# Patient Record
Sex: Female | Born: 1990 | Hispanic: No | Marital: Married | Smoking: Never smoker
Health system: Southern US, Community
[De-identification: ages and names within clinical notes are randomized; demographics above are authoritative.]

## PROBLEM LIST (undated history)

## (undated) ENCOUNTER — Inpatient Hospital Stay (HOSPITAL_COMMUNITY): Payer: Self-pay

## (undated) DIAGNOSIS — D649 Anemia, unspecified: Secondary | ICD-10-CM

## (undated) DIAGNOSIS — Z789 Other specified health status: Secondary | ICD-10-CM

## (undated) DIAGNOSIS — F419 Anxiety disorder, unspecified: Secondary | ICD-10-CM

## (undated) HISTORY — PX: NO PAST SURGERIES: SHX2092

---

## 2016-12-16 NOTE — L&D Delivery Note (Signed)
Pt presented with c/o SROM. On exam pt had +pool, +fern. She was 2 cm. She was admitted and started on pit aug. She progressed rapidly . She pushed for several hours. She had a SVD of one live viable female infant in the LOA postion over a second degree midline tear. Nuchal cord x 1. Placenta-S/I. EBL-400cc. Baby to NBN/ Tear closed with 3-0 chromic.

## 2017-05-29 LAB — OB RESULTS CONSOLE GC/CHLAMYDIA
Chlamydia: NEGATIVE
GC PROBE AMP, GENITAL: NEGATIVE

## 2017-05-29 LAB — OB RESULTS CONSOLE ABO/RH: RH TYPE: POSITIVE

## 2017-05-29 LAB — OB RESULTS CONSOLE RUBELLA ANTIBODY, IGM: RUBELLA: IMMUNE

## 2017-05-29 LAB — OB RESULTS CONSOLE HIV ANTIBODY (ROUTINE TESTING): HIV: NONREACTIVE

## 2017-05-29 LAB — OB RESULTS CONSOLE ANTIBODY SCREEN: ANTIBODY SCREEN: NEGATIVE

## 2017-05-29 LAB — OB RESULTS CONSOLE HEPATITIS B SURFACE ANTIGEN: HEP B S AG: NEGATIVE

## 2017-05-30 ENCOUNTER — Encounter (HOSPITAL_COMMUNITY): Payer: Self-pay | Admitting: *Deleted

## 2017-05-30 ENCOUNTER — Inpatient Hospital Stay (HOSPITAL_COMMUNITY)
Admission: AD | Admit: 2017-05-30 | Discharge: 2017-05-30 | Disposition: A | Discharge status: Home / Self Care | Payer: Medicaid Other | Source: Ambulatory Visit | Attending: Obstetrics and Gynecology | Admitting: Obstetrics and Gynecology

## 2017-05-30 DIAGNOSIS — R4589 Other symptoms and signs involving emotional state: Secondary | ICD-10-CM

## 2017-05-30 DIAGNOSIS — Z3490 Encounter for supervision of normal pregnancy, unspecified, unspecified trimester: Secondary | ICD-10-CM

## 2017-05-30 DIAGNOSIS — Z3A11 11 weeks gestation of pregnancy: Secondary | ICD-10-CM | POA: Insufficient documentation

## 2017-05-30 DIAGNOSIS — R4582 Worries: Secondary | ICD-10-CM

## 2017-05-30 DIAGNOSIS — O26851 Spotting complicating pregnancy, first trimester: Secondary | ICD-10-CM | POA: Insufficient documentation

## 2017-05-30 DIAGNOSIS — N939 Abnormal uterine and vaginal bleeding, unspecified: Secondary | ICD-10-CM | POA: Diagnosis present

## 2017-05-30 DIAGNOSIS — O26859 Spotting complicating pregnancy, unspecified trimester: Secondary | ICD-10-CM

## 2017-05-30 HISTORY — DX: Other specified health status: Z78.9

## 2017-05-30 LAB — URINALYSIS, ROUTINE W REFLEX MICROSCOPIC
BACTERIA UA: NONE SEEN
BILIRUBIN URINE: NEGATIVE
Glucose, UA: NEGATIVE mg/dL
KETONES UR: NEGATIVE mg/dL
NITRITE: NEGATIVE
PH: 8 (ref 5.0–8.0)
PROTEIN: NEGATIVE mg/dL
Specific Gravity, Urine: 1.002 — ABNORMAL LOW (ref 1.005–1.030)

## 2017-05-30 LAB — ABO/RH: ABO/RH(D): B POS

## 2017-05-30 NOTE — MAU Provider Note (Signed)
History     CSN: [id number]  Arrival date and time: 05/30/17 1423   First Provider Initiated Contact with Patient 05/30/17 1513      Chief Complaint  Patient presents with  . Vaginal Bleeding   HPI   [name] is a 26 y.o. female G1P0 @ 537w6d here in MAU with spotting. She wiped this morning and noted it on her tissue paper. It was very light. She is not having any pain. She denies bleeding now.  OB History    Gravida Para Term Preterm AB Living   1             SAB TAB Ectopic Multiple Live Births                  Past Medical History:  Diagnosis Date  . Medical history non-contributory     Past Surgical History:  Procedure Laterality Date  . NO PAST SURGERIES      History reviewed. No pertinent family history.  Social History  Substance Use Topics  . Smoking status: Never Smoker  . Smokeless tobacco: Never Used  . Alcohol use No    Allergies: Allergies not on file  No prescriptions prior to admission.   Results for orders placed or performed during the hospital encounter of 05/30/17 (from the past 48 hour(s))  Urinalysis, Routine w reflex microscopic     Status: Abnormal   Collection Time: 05/30/17  2:50 PM  Result Value Ref Range   Color, Urine STRAW (A) YELLOW   APPearance CLEAR CLEAR   Specific Gravity, Urine 1.002 (L) 1.005 - 1.030   pH 8.0 5.0 - 8.0   Glucose, UA NEGATIVE NEGATIVE mg/dL   Hgb urine dipstick SMALL (A) NEGATIVE   Bilirubin Urine NEGATIVE NEGATIVE   Ketones, ur NEGATIVE NEGATIVE mg/dL   Protein, ur NEGATIVE NEGATIVE mg/dL   Nitrite NEGATIVE NEGATIVE   Leukocytes, UA SMALL (A) NEGATIVE   RBC / HPF 0-5 0 - 5 RBC/hpf   WBC, UA 6-30 0 - 5 WBC/hpf   Bacteria, UA NONE SEEN NONE SEEN   Squamous Epithelial / LPF 0-5 (A) NONE SEEN  ABO/Rh     Status: None (Preliminary result)   Collection Time: 05/30/17  3:56 PM  Result Value Ref Range   ABO/RH(D) B POS    Review of Systems  Genitourinary: Positive for vaginal bleeding.  Negative for dysuria.   Physical Exam   Blood pressure 113/64, pulse 78, temperature 98.5 F (36.9 C), temperature source Oral, resp. rate 16, weight 110 lb 4 oz (50 kg), SpO2 100 %.  Physical Exam  Constitutional: She is oriented to person, place, and time. She appears well-developed and well-nourished. No distress.  HENT:  Head: Normocephalic.  Eyes: Pupils are equal, round, and reactive to light.  Neck: Neck supple.  Genitourinary:  Genitourinary Comments: Vagina - Small amount of white vaginal discharge, no odor  Cervix - No contact bleeding, no active bleeding  Bimanual exam: Cervix closed Chaperone present for exam  Musculoskeletal: Normal range of motion.  Neurological: She is alert and oriented to person, place, and time.  Skin: Skin is warm. She is not diaphoretic.  Psychiatric: Her behavior is normal.    MAU Course  Procedures  None  MDM  + fetal heart tones via doppler  Patient says she is B positive  Urine culture.   B positive blood type  Discussed patient with Dr. Claiborne Billingsallahan.   Assessment and Plan   A:  1. Spotting in  pregnancy   2. Presence of fetal heart sounds, antepartum   3. Feeling worried      P:  Discharge home in stable condition Strict return precautions Pelvic rest Return to MAU as needed if symptoms worsen  Muad Noga, Harolyn Rutherford, NP 05/30/2017 6:29 PM

## 2017-05-30 NOTE — MAU Note (Signed)
Pinkish noted when wiped this morning, has not seen it again. No pain.  Denies pain with urination.  Had US in office  Yesterday, everything was fine

## 2017-05-31 LAB — CULTURE, OB URINE
CULTURE: NO GROWTH
Special Requests: NORMAL

## 2017-08-15 ENCOUNTER — Inpatient Hospital Stay (HOSPITAL_COMMUNITY)
Admission: AD | Admit: 2017-08-15 | Discharge: 2017-08-15 | Disposition: A | Discharge status: Home / Self Care | Payer: Medicaid Other | Source: Ambulatory Visit | Attending: Obstetrics and Gynecology | Admitting: Obstetrics and Gynecology

## 2017-08-15 ENCOUNTER — Encounter (HOSPITAL_COMMUNITY): Payer: Self-pay | Admitting: *Deleted

## 2017-08-15 DIAGNOSIS — R8299 Other abnormal findings in urine: Secondary | ICD-10-CM | POA: Insufficient documentation

## 2017-08-15 DIAGNOSIS — N898 Other specified noninflammatory disorders of vagina: Secondary | ICD-10-CM

## 2017-08-15 DIAGNOSIS — N899 Noninflammatory disorder of vagina, unspecified: Secondary | ICD-10-CM | POA: Diagnosis not present

## 2017-08-15 DIAGNOSIS — N939 Abnormal uterine and vaginal bleeding, unspecified: Secondary | ICD-10-CM | POA: Diagnosis present

## 2017-08-15 DIAGNOSIS — O9989 Other specified diseases and conditions complicating pregnancy, childbirth and the puerperium: Secondary | ICD-10-CM | POA: Diagnosis not present

## 2017-08-15 DIAGNOSIS — O26892 Other specified pregnancy related conditions, second trimester: Secondary | ICD-10-CM | POA: Diagnosis not present

## 2017-08-15 DIAGNOSIS — R82998 Other abnormal findings in urine: Secondary | ICD-10-CM

## 2017-08-15 LAB — URINALYSIS, ROUTINE W REFLEX MICROSCOPIC
BILIRUBIN URINE: NEGATIVE
Glucose, UA: NEGATIVE mg/dL
HGB URINE DIPSTICK: NEGATIVE
KETONES UR: NEGATIVE mg/dL
NITRITE: NEGATIVE
PROTEIN: NEGATIVE mg/dL
Specific Gravity, Urine: 1.011 (ref 1.005–1.030)
pH: 7 (ref 5.0–8.0)

## 2017-08-15 NOTE — Discharge Instructions (Signed)
Asymptomatic Bacteriuria Asymptomatic bacteriuria is the presence of a large number of bacteria in the urine without the usual symptoms of burning or frequent urination. What are the causes? This condition is caused by an increase in bacteria in the urine. This increase can be caused by:  Bacteria entering the urinary tract, such as during sex.  A blockage in the urinary tract, such as from kidney stones or a tumor.  Bladder problems that prevent the bladder from emptying.  What increases the risk? You are more likely to develop this condition if:  You have diabetes mellitus.  You are an elderly adult, especially if you are also in a long-term care facility.  You are pregnant and in the first trimester.  You have kidney stones.  You are female.  You have had a kidney transplant.  You have a leaky kidney tube valve (reflux).  You had a urinary catheter for a long period of time.  What are the signs or symptoms? There are no symptoms of this condition. How is this diagnosed? This condition is diagnosed with a urine test. Because this condition does not cause symptoms, it is usually diagnosed when a urine sample is taken to treat or diagnose another condition, such as pregnancy or kidney problems. Most women who are in their first trimester of pregnancy are screened for asymptomatic bacteriuria. How is this treated? Usually, treatment is not needed for this condition. Treating the condition can lead to other problems, such as a yeast infection or the growth of bacteria that do not respond to treatment (antibiotic-resistant bacteria). Some people, such as pregnant women and people with kidney transplants, do need treatment with antibiotic medicines to prevent kidney infection (pyelonephritis). In pregnant women, kidney infection can lead to premature labor, fetal growth restriction, or newborn death. Follow these instructions at home: Medicines  Take over-the-counter and  prescription medicines only as told by your health care provider.  If you were prescribed an antibiotic medicine, take it as told by your health care provider. Do not stop taking the antibiotic even if you start to feel better. General instructions  Monitor your condition for any changes.  Drink enough fluid to keep your urine clear or pale yellow.  Go to the bathroom more often to keep your bladder empty.  If you are female, keep the area around your vagina and rectum clean. Wipe yourself from front to back after urinating.  Keep all follow-up visits as told by your health care provider. This is important. Contact a health care provider if:  You notice any new symptoms, such as back pain or burning while urinating. Get help right away if:  You develop signs of an infection such as: ? A burning sensation when you urinate. ? Have pain when you urinate. ? Develop an intense need to urinate. ? Urinating more frequently. ? Back pain or pelvic pain. ? Fever or chills.  You have blood in your urine.  Your urine becomes discolored or cloudy.  Your urine smells bad.  You have severe pain that cannot be controlled with medicine. Summary  Asymptomatic bacteriuria is the presence of a large number of bacteria in the urine without the usual symptoms of burning or frequent urination.  Usually, treatment is not needed for this condition. Treating the condition can lead to other problems, such as too much yeast and the growth of antibiotic-resistant bacteria.  Some people, such as pregnant women and people with kidney transplants, do need treatment with antibiotic medicines to prevent   kidney infection (pyelonephritis).  If you were prescribed an antibiotic medicine, take it as told by your health care provider. Do not stop taking the antibiotic even if you start to feel better. This information is not intended to replace advice given to you by your health care provider. Make sure you  discuss any questions you have with your health care provider. Document Released: 12/02/2005 Document Revised: 11/26/2016 Document Reviewed: 11/26/2016 Elsevier Interactive Patient Education  2017 [employer]  

## 2017-08-15 NOTE — MAU Note (Signed)
PT  SAYS SHE WENT  TO B-ROOM YESTERDAY AND SAW 1 DOT  OF BLOOD  THEN HAPPENED AGAIN THIS AM .  DENIES  ANY PAIN  OR CRAMPS .  PNC  WITH DR  Chestine SporeLARK .   NO SEX IN AUG.

## 2017-08-15 NOTE — MAU Provider Note (Signed)
History     CSN: [id number]  Arrival date and time: 08/15/17 [id number]   None     Chief Complaint  Patient presents with  . Vaginal Bleeding   HPI   [name] is a 26 y.o. female G1P0 @ 6775w6d here in MAU with vaginal bleeding. She first noticed the bleeding yesterday. She was not doing anything at the time it started. She noticed it when she used the bathroom. She denies pain. No recent intercourse.   OB History    Gravida Para Term Preterm AB Living   1             SAB TAB Ectopic Multiple Live Births                  Past Medical History:  Diagnosis Date  . Medical history non-contributory     Past Surgical History:  Procedure Laterality Date  . NO PAST SURGERIES      History reviewed. No pertinent family history.  Social History  Substance Use Topics  . Smoking status: Never Smoker  . Smokeless tobacco: Never Used  . Alcohol use No    Allergies: No Known Allergies  No prescriptions prior to admission.   Results for orders placed or performed during the hospital encounter of 08/15/17 (from the past 48 hour(s))  Urinalysis, Routine w reflex microscopic     Status: Abnormal   Collection Time: 08/15/17  7:02 AM  Result Value Ref Range   Color, Urine YELLOW YELLOW   APPearance CLEAR CLEAR   Specific Gravity, Urine 1.011 1.005 - 1.030   pH 7.0 5.0 - 8.0   Glucose, UA NEGATIVE NEGATIVE mg/dL   Hgb urine dipstick NEGATIVE NEGATIVE   Bilirubin Urine NEGATIVE NEGATIVE   Ketones, ur NEGATIVE NEGATIVE mg/dL   Protein, ur NEGATIVE NEGATIVE mg/dL   Nitrite NEGATIVE NEGATIVE   Leukocytes, UA LARGE (A) NEGATIVE   RBC / HPF 0-5 0 - 5 RBC/hpf   WBC, UA 6-30 0 - 5 WBC/hpf   Bacteria, UA RARE (A) NONE SEEN   Squamous Epithelial / LPF 0-5 (A) NONE SEEN   Mucus PRESENT    Review of Systems  Gastrointestinal: Negative for abdominal pain.  Genitourinary: Negative for dyspareunia and dysuria.   Physical Exam   Blood pressure (!) 105/58, pulse 81, temperature 98.6  F (37 C), temperature source Oral, resp. rate 18, height 5\' 1"  (1.549 m), weight 123 lb 4 oz (55.9 kg).  Physical Exam  Constitutional: She is oriented to person, place, and time. She appears well-developed and well-nourished. No distress.  HENT:  Head: Normocephalic.  Respiratory: Effort normal.  GI: Soft. She exhibits no distension. There is no tenderness.  Genitourinary:  Genitourinary Comments: Vagina - Small amount of white vaginal discharge, no odor  2 mm open lesion on right labia, near introitus. Non tender. Some blood noted after manipulation.  Cervix - No contact bleeding, no active bleeding  Bimanual exam: Cervix closed Chaperone present for exam.   Neurological: She is alert and oriented to person, place, and time.  Skin: Skin is warm. She is not diaphoretic.  Psychiatric: Her behavior is normal.   MAU Course  Procedures  None  MDM  HSV collected, low suspicion  Urine culture pending. Discussed with Dr. Claiborne Billingsallahan. Ok for discharge.  + heart tones via doppler    Assessment and Plan   A:  Vaginal sore  Leukocytes in urine   P:  Discharge home in stable condition Return to MAU if  symptoms worsen Urine culture pending  Venia Carbon I, NP 08/15/2017 2:38 PM

## 2017-08-17 LAB — HSV CULTURE AND TYPING

## 2017-08-17 LAB — CULTURE, OB URINE: Special Requests: NORMAL

## 2017-11-02 ENCOUNTER — Inpatient Hospital Stay (HOSPITAL_COMMUNITY)
Admission: AD | Admit: 2017-11-02 | Discharge: 2017-11-03 | Disposition: A | Discharge status: Home / Self Care | Payer: Medicaid Other | Source: Ambulatory Visit | Attending: Obstetrics and Gynecology | Admitting: Obstetrics and Gynecology

## 2017-11-02 ENCOUNTER — Encounter (HOSPITAL_COMMUNITY): Payer: Self-pay | Admitting: *Deleted

## 2017-11-02 DIAGNOSIS — Z3A34 34 weeks gestation of pregnancy: Secondary | ICD-10-CM | POA: Insufficient documentation

## 2017-11-02 DIAGNOSIS — R1110 Vomiting, unspecified: Secondary | ICD-10-CM | POA: Diagnosis present

## 2017-11-02 DIAGNOSIS — O212 Late vomiting of pregnancy: Secondary | ICD-10-CM | POA: Diagnosis not present

## 2017-11-02 LAB — URINALYSIS, ROUTINE W REFLEX MICROSCOPIC
BILIRUBIN URINE: NEGATIVE
GLUCOSE, UA: NEGATIVE mg/dL
HGB URINE DIPSTICK: NEGATIVE
KETONES UR: NEGATIVE mg/dL
NITRITE: NEGATIVE
PH: 6 (ref 5.0–8.0)
PROTEIN: 30 mg/dL — AB
Specific Gravity, Urine: 1.024 (ref 1.005–1.030)

## 2017-11-02 MED ORDER — M.V.I. ADULT IV INJ
Freq: Once | INTRAVENOUS | Status: AC
  Administered 2017-11-03: 01:00:00 via INTRAVENOUS
  Filled 2017-11-02: qty 1000

## 2017-11-02 MED ORDER — PROMETHAZINE HCL 25 MG/ML IJ SOLN
25.0000 mg | Freq: Once | INTRAVENOUS | Status: AC
  Administered 2017-11-03: 25 mg via INTRAVENOUS
  Filled 2017-11-02: qty 1

## 2017-11-02 NOTE — MAU Note (Signed)
Pt presents to MAU c/o nausea and vomiting that started at 2200. Pt states she has only vomited once tonight, this is a new problem. Pt reports a mild pain on her upper abdominal area. Pt rates the pain at a5 on a 0-10 scale. Pt denies vaginal bleeding and discharge. Pt has not taken any medications for the nausea. Pt reports good FM. Pt denies CTX.

## 2017-11-02 NOTE — MAU Provider Note (Signed)
History     CSN: [id number]  Arrival date and time: 11/02/17 2230   First Provider Initiated Contact with Patient 11/02/17 2338      Chief Complaint  Patient presents with  . Emesis   HPI  Ms. Christine [name] is 26 y.o. G1P0 5265w1d gestation presenting to MAU with complaints of abdominal pain and vomiting x 1 episode; none of which is happening now. She ate salmon and vomited afterwards. She reports not typically eating salmon. Denies VB, LOF, or abnormal vaginal d/c.  Past Medical History:  Diagnosis Date  . Medical history non-contributory     Past Surgical History:  Procedure Laterality Date  . NO PAST SURGERIES      No family history on file.  Social History   Tobacco Use  . Smoking status: Never Smoker  . Smokeless tobacco: Never Used  Substance Use Topics  . Alcohol use: No  . Drug use: No    Allergies: No Known Allergies  Medications Prior to Admission  Medication Sig Dispense Refill Last Dose  . ferrous sulfate 325 (65 FE) MG tablet Take 325 mg daily with breakfast by mouth.   11/02/2017 at Unknown time  . Prenatal Vit-Fe Fumarate-FA (PRENATAL MULTIVITAMIN) TABS tablet Take 1 tablet daily at 12 noon by mouth.   11/01/2017 at Unknown time    Review of Systems  Constitutional: Negative.   HENT: Negative.   Eyes: Negative.   Respiratory: Negative.   Cardiovascular: Negative.   Gastrointestinal: Positive for abdominal pain (upper abd pain; not now) and vomiting (x1 episode).  Endocrine: Negative.   Genitourinary: Negative.   Musculoskeletal: Negative.   Skin: Negative.   Allergic/Immunologic: Negative.   Neurological: Negative.   Hematological: Negative.   Psychiatric/Behavioral: Negative.    Physical Exam   Blood pressure 117/68, pulse (!) 103, temperature 97.7 F (36.5 C), temperature source Oral, resp. rate 16, height 5' 0.63" (1.54 m), weight 141 lb (64 kg).  Physical Exam  Nursing note and vitals reviewed. Constitutional: She is oriented to  person, place, and time. She appears well-developed.  HENT:  Head: Normocephalic.  Eyes: Pupils are equal, round, and reactive to light.  Neck: Normal range of motion.  Cardiovascular: Normal rate, regular rhythm and normal heart sounds.  Respiratory: Effort normal and breath sounds normal.  GI: Soft. Bowel sounds are normal.  Musculoskeletal: Normal range of motion.  Neurological: She is alert and oriented to person, place, and time. She has normal reflexes.  Skin: Skin is warm and dry.  Psychiatric: She has a normal mood and affect. Her behavior is normal. Judgment and thought content normal.    MAU Course  Procedures  MDM CCUA UCx -- pending NST - FHR: 120 bpm / moderate variability / accels present / decels absent / TOCO: regular every 5-6 mins -- pt unaware IVFs: Phenergan 25 mg in LR 1000 ml @ 999 ml/hr; then MVI in LR 1000 ml @ 500 ml/hr  *Consult with Dr. Dareen PianoAnderson @ [phone] - notified of patient's complaints, assessments, lab results, tx plan d/c home - ok to d/c home, agrees with plan  Results for orders placed or performed during the hospital encounter of 11/02/17 (from the past 24 hour(s))  Urinalysis, Routine w reflex microscopic     Status: Abnormal   Collection Time: 11/02/17 10:42 PM  Result Value Ref Range   Color, Urine AMBER (A) YELLOW   APPearance CLOUDY (A) CLEAR   Specific Gravity, Urine 1.024 1.005 - 1.030   pH 6.0 5.0 -  8.0   Glucose, UA NEGATIVE NEGATIVE mg/dL   Hgb urine dipstick NEGATIVE NEGATIVE   Bilirubin Urine NEGATIVE NEGATIVE   Ketones, ur NEGATIVE NEGATIVE mg/dL   Protein, ur 30 (A) NEGATIVE mg/dL   Nitrite NEGATIVE NEGATIVE   Leukocytes, UA LARGE (A) NEGATIVE   RBC / HPF 6-30 0 - 5 RBC/hpf   WBC, UA TOO NUMEROUS TO COUNT 0 - 5 WBC/hpf   Bacteria, UA RARE (A) NONE SEEN   Squamous Epithelial / LPF 6-30 (A) NONE SEEN   WBC Clumps PRESENT    Mucus PRESENT    Ca Oxalate Crys, UA PRESENT     Assessment and Plan  Vomiting, intractability of  vomiting not specified, presence of nausea not specified, unspecified vomiting type - Advised to not eat salmon - Stay well-hydrated  Discharge home Keep scheduled appt with [facility] Patient verbalized an understanding of the plan of care and agrees.    [name], MSN, CNM 11/02/2017, 11:30 PM

## 2017-11-03 DIAGNOSIS — O212 Late vomiting of pregnancy: Secondary | ICD-10-CM

## 2017-11-03 DIAGNOSIS — R1110 Vomiting, unspecified: Secondary | ICD-10-CM | POA: Diagnosis present

## 2017-11-03 DIAGNOSIS — Z3A34 34 weeks gestation of pregnancy: Secondary | ICD-10-CM | POA: Diagnosis not present

## 2017-11-22 LAB — OB RESULTS CONSOLE GBS: STREP GROUP B AG: NEGATIVE

## 2017-11-25 ENCOUNTER — Other Ambulatory Visit: Payer: Self-pay

## 2017-11-25 ENCOUNTER — Inpatient Hospital Stay (HOSPITAL_COMMUNITY): Payer: Medicaid Other | Admitting: Anesthesiology

## 2017-11-25 ENCOUNTER — Encounter (HOSPITAL_COMMUNITY): Payer: Self-pay | Admitting: *Deleted

## 2017-11-25 ENCOUNTER — Inpatient Hospital Stay (HOSPITAL_COMMUNITY)
Admission: AD | Admit: 2017-11-25 | Discharge: 2017-11-27 | DRG: 807 | Disposition: A | Discharge status: Home / Self Care | Payer: Medicaid Other | Source: Ambulatory Visit | Attending: Obstetrics and Gynecology | Admitting: Obstetrics and Gynecology

## 2017-11-25 DIAGNOSIS — O9902 Anemia complicating childbirth: Secondary | ICD-10-CM | POA: Diagnosis present

## 2017-11-25 DIAGNOSIS — Z3483 Encounter for supervision of other normal pregnancy, third trimester: Secondary | ICD-10-CM | POA: Diagnosis present

## 2017-11-25 DIAGNOSIS — R1110 Vomiting, unspecified: Secondary | ICD-10-CM

## 2017-11-25 DIAGNOSIS — Z3490 Encounter for supervision of normal pregnancy, unspecified, unspecified trimester: Secondary | ICD-10-CM

## 2017-11-25 DIAGNOSIS — D649 Anemia, unspecified: Secondary | ICD-10-CM | POA: Diagnosis present

## 2017-11-25 DIAGNOSIS — Z3A37 37 weeks gestation of pregnancy: Secondary | ICD-10-CM | POA: Diagnosis not present

## 2017-11-25 LAB — CBC
HCT: 36.1 % (ref 36.0–46.0)
Hemoglobin: 11.8 g/dL — ABNORMAL LOW (ref 12.0–15.0)
MCH: 30.3 pg (ref 26.0–34.0)
MCHC: 32.7 g/dL (ref 30.0–36.0)
MCV: 92.6 fL (ref 78.0–100.0)
PLATELETS: 178 10*3/uL (ref 150–400)
RBC: 3.9 MIL/uL (ref 3.87–5.11)
RDW: 13.7 % (ref 11.5–15.5)
WBC: 13.2 10*3/uL — AB (ref 4.0–10.5)

## 2017-11-25 LAB — POCT FERN TEST: POCT Fern Test: POSITIVE

## 2017-11-25 LAB — TYPE AND SCREEN
ABO/RH(D): B POS
ANTIBODY SCREEN: NEGATIVE

## 2017-11-25 MED ORDER — EPHEDRINE 5 MG/ML INJ
10.0000 mg | INTRAVENOUS | Status: DC | PRN
  Filled 2017-11-25: qty 2

## 2017-11-25 MED ORDER — OXYTOCIN BOLUS FROM INFUSION
500.0000 mL | Freq: Once | INTRAVENOUS | Status: AC
  Administered 2017-11-25: 500 mL via INTRAVENOUS

## 2017-11-25 MED ORDER — DIBUCAINE 1 % RE OINT: 1.0000 "application " | TOPICAL_OINTMENT | RECTAL | Status: DC | PRN

## 2017-11-25 MED ORDER — COCONUT OIL OIL: 1.0000 "application " | TOPICAL_OIL | Status: DC | PRN

## 2017-11-25 MED ORDER — OXYCODONE-ACETAMINOPHEN 5-325 MG PO TABS: 2.0000 | ORAL_TABLET | ORAL | Status: DC | PRN

## 2017-11-25 MED ORDER — OXYCODONE-ACETAMINOPHEN 5-325 MG PO TABS: 1.0000 | ORAL_TABLET | ORAL | Status: DC | PRN

## 2017-11-25 MED ORDER — LACTATED RINGERS IV SOLN
500.0000 mL | Freq: Once | INTRAVENOUS | Status: AC
  Administered 2017-11-25: 500 mL via INTRAVENOUS

## 2017-11-25 MED ORDER — ONDANSETRON HCL 4 MG/2ML IJ SOLN
4.0000 mg | Freq: Four times a day (QID) | INTRAMUSCULAR | Status: DC | PRN
Start: 2017-11-25 — End: 2017-11-25

## 2017-11-25 MED ORDER — OXYTOCIN 40 UNITS IN LACTATED RINGERS INFUSION - SIMPLE MED
2.5000 [IU]/h | INTRAVENOUS | Status: DC
  Administered 2017-11-25: 2.5 [IU]/h via INTRAVENOUS

## 2017-11-25 MED ORDER — FLEET ENEMA 7-19 GM/118ML RE ENEM: 1.0000 | ENEMA | Freq: Every day | RECTAL | Status: DC | PRN

## 2017-11-25 MED ORDER — ZOLPIDEM TARTRATE 5 MG PO TABS: 5.0000 mg | ORAL_TABLET | Freq: Every evening | ORAL | Status: DC | PRN

## 2017-11-25 MED ORDER — FENTANYL 2.5 MCG/ML BUPIVACAINE 1/10 % EPIDURAL INFUSION (WH - ANES)
14.0000 mL/h | INTRAMUSCULAR | Status: DC | PRN
  Administered 2017-11-25: 14 mL/h via EPIDURAL
  Filled 2017-11-25: qty 100

## 2017-11-25 MED ORDER — ACETAMINOPHEN 325 MG PO TABS: 650.0000 mg | ORAL_TABLET | ORAL | Status: DC | PRN

## 2017-11-25 MED ORDER — FERROUS SULFATE 325 (65 FE) MG PO TABS
325.0000 mg | ORAL_TABLET | Freq: Every day | ORAL | Status: DC
  Administered 2017-11-27: 325 mg via ORAL
  Filled 2017-11-25: qty 1

## 2017-11-25 MED ORDER — PRENATAL MULTIVITAMIN CH
1.0000 | ORAL_TABLET | Freq: Every day | ORAL | Status: DC
  Administered 2017-11-26 – 2017-11-27 (×2): 1 via ORAL
  Filled 2017-11-25 (×2): qty 1

## 2017-11-25 MED ORDER — ONDANSETRON HCL 4 MG/2ML IJ SOLN: 4.0000 mg | INTRAMUSCULAR | Status: DC | PRN

## 2017-11-25 MED ORDER — LIDOCAINE HCL (PF) 1 % IJ SOLN
INTRAMUSCULAR | Status: DC | PRN
  Administered 2017-11-25 (×2): 5 mL

## 2017-11-25 MED ORDER — LACTATED RINGERS IV SOLN: 500.0000 mL | INTRAVENOUS | Status: DC | PRN

## 2017-11-25 MED ORDER — SENNOSIDES-DOCUSATE SODIUM 8.6-50 MG PO TABS
2.0000 | ORAL_TABLET | ORAL | Status: DC
  Administered 2017-11-26: 2 via ORAL
  Filled 2017-11-25 (×2): qty 2

## 2017-11-25 MED ORDER — LIDOCAINE HCL (PF) 1 % IJ SOLN
30.0000 mL | INTRAMUSCULAR | Status: DC | PRN
  Filled 2017-11-25: qty 30

## 2017-11-25 MED ORDER — OXYTOCIN 40 UNITS IN LACTATED RINGERS INFUSION - SIMPLE MED
1.0000 m[IU]/min | INTRAVENOUS | Status: DC
  Administered 2017-11-25: 4 m[IU]/min via INTRAVENOUS
  Administered 2017-11-25: 6 m[IU]/min via INTRAVENOUS
  Administered 2017-11-25: 2 m[IU]/min via INTRAVENOUS
  Filled 2017-11-25: qty 1000

## 2017-11-25 MED ORDER — SOD CITRATE-CITRIC ACID 500-334 MG/5ML PO SOLN: 30.0000 mL | ORAL | Status: DC | PRN

## 2017-11-25 MED ORDER — MEASLES, MUMPS & RUBELLA VAC ~~LOC~~ INJ
0.5000 mL | INJECTION | Freq: Once | SUBCUTANEOUS | Status: DC
  Filled 2017-11-25: qty 0.5

## 2017-11-25 MED ORDER — TETANUS-DIPHTH-ACELL PERTUSSIS 5-2.5-18.5 LF-MCG/0.5 IM SUSP: 0.5000 mL | Freq: Once | INTRAMUSCULAR | Status: DC

## 2017-11-25 MED ORDER — IBUPROFEN 600 MG PO TABS
600.0000 mg | ORAL_TABLET | Freq: Four times a day (QID) | ORAL | Status: DC
  Administered 2017-11-26 – 2017-11-27 (×7): 600 mg via ORAL
  Filled 2017-11-25 (×7): qty 1

## 2017-11-25 MED ORDER — FENTANYL CITRATE (PF) 100 MCG/2ML IJ SOLN: 100.0000 ug | INTRAMUSCULAR | Status: DC | PRN

## 2017-11-25 MED ORDER — SIMETHICONE 80 MG PO CHEW: 80.0000 mg | CHEWABLE_TABLET | ORAL | Status: DC | PRN

## 2017-11-25 MED ORDER — DIPHENHYDRAMINE HCL 50 MG/ML IJ SOLN: 12.5000 mg | INTRAMUSCULAR | Status: DC | PRN

## 2017-11-25 MED ORDER — WITCH HAZEL-GLYCERIN EX PADS
1.0000 | MEDICATED_PAD | CUTANEOUS | Status: DC | PRN
Start: 2017-11-25 — End: 2017-11-27
  Administered 2017-11-26: 1 via TOPICAL

## 2017-11-25 MED ORDER — PHENYLEPHRINE 40 MCG/ML (10ML) SYRINGE FOR IV PUSH (FOR BLOOD PRESSURE SUPPORT)
80.0000 ug | PREFILLED_SYRINGE | INTRAVENOUS | Status: DC | PRN
  Filled 2017-11-25: qty 5

## 2017-11-25 MED ORDER — ONDANSETRON HCL 4 MG PO TABS: 4.0000 mg | ORAL_TABLET | ORAL | Status: DC | PRN

## 2017-11-25 MED ORDER — TERBUTALINE SULFATE 1 MG/ML IJ SOLN
0.2500 mg | Freq: Once | INTRAMUSCULAR | Status: DC | PRN
  Filled 2017-11-25: qty 1

## 2017-11-25 MED ORDER — BENZOCAINE-MENTHOL 20-0.5 % EX AERO
1.0000 | INHALATION_SPRAY | CUTANEOUS | Status: DC | PRN
Start: 2017-11-25 — End: 2017-11-27

## 2017-11-25 MED ORDER — PHENYLEPHRINE 40 MCG/ML (10ML) SYRINGE FOR IV PUSH (FOR BLOOD PRESSURE SUPPORT)
80.0000 ug | PREFILLED_SYRINGE | INTRAVENOUS | Status: DC | PRN
  Filled 2017-11-25: qty 10
  Filled 2017-11-25: qty 5

## 2017-11-25 MED ORDER — LACTATED RINGERS IV SOLN
INTRAVENOUS | Status: DC
  Administered 2017-11-25 (×2): via INTRAVENOUS

## 2017-11-25 NOTE — MAU Note (Signed)
Urine in lab 

## 2017-11-25 NOTE — H&P (Signed)
Christine [name] is an 26 y.o. G1P0 459w3d female. Who presented to the ER with SROM. Her PNC was complicated by anemia. She had +pool and +fern on admission. Cx was reported to be 2cm. GBS-neg  Chief Complaint: HPI:  Past Medical History:  Diagnosis Date  . Medical history non-contributory     Past Surgical History:  Procedure Laterality Date  . NO PAST SURGERIES      History reviewed. No pertinent family history. Social History:  reports that  has never smoked. she has never used smokeless tobacco. She reports that she does not drink alcohol or use drugs.  Allergies: No Known Allergies  Medications Prior to Admission  Medication Sig Dispense Refill  . ferrous sulfate 325 (65 FE) MG tablet Take 325 mg daily with breakfast by mouth.    . Prenatal Vit-Fe Fumarate-FA (PRENATAL MULTIVITAMIN) TABS tablet Take 1 tablet daily at 12 noon by mouth.         Blood pressure 119/69, pulse (!) 107, temperature 97.7 F (36.5 C), temperature source Oral, resp. rate 20, height 5' 1.42" (1.56 m), weight 148 lb (67.1 kg), SpO2 99 %. General appearance: alert, cooperative and appears stated age Abdomen: gravid, nontender   Lab Results  Component Value Date   WBC 13.2 (H) 11/25/2017   HGB 11.8 (L) 11/25/2017   HCT 36.1 11/25/2017   MCV 92.6 11/25/2017   PLT 178 11/25/2017   No results found for: PREGTESTUR, PREGSERUM, HCG, HCGQUANT     Patient Active Problem List   Diagnosis Date Noted  . Indication for care in labor or delivery 11/25/2017  . Vomiting 11/03/2017   IMP/ IUP at 37 wks with SROM Plan/ Admit          Start Pit Aug  Christine [name],Christine [name] 11/25/2017, 6:09 PM

## 2017-11-25 NOTE — MAU Note (Signed)
+  LOF Clear since 6am  +bloody show  +FM  Denies any complications with the pregnancy.  Denies recent VE; states was head down in office last week.  +contractions States has been up all night with them Rating pain 6/10

## 2017-11-25 NOTE — Anesthesia Procedure Notes (Signed)
Epidural Patient location during procedure: OB  Staffing Anesthesiologist: Rajesh Wyss, MD Performed: anesthesiologist   Preanesthetic Checklist Completed: patient identified, site marked, surgical consent, pre-op evaluation, timeout performed, IV checked, risks and benefits discussed and monitors and equipment checked  Epidural Patient position: sitting Prep: DuraPrep Patient monitoring: heart rate, continuous pulse ox and blood pressure Approach: right paramedian Location: L3-L4 Injection technique: LOR saline  Needle:  Needle type: Tuohy  Needle gauge: 17 G Needle length: 9 cm and 9 Needle insertion depth: 5 cm Catheter type: closed end flexible Catheter size: 20 Guage Catheter at skin depth: 9 cm Test dose: negative  Assessment Events: blood not aspirated, injection not painful, no injection resistance, negative IV test and no paresthesia  Additional Notes Patient identified. Risks/Benefits/Options discussed with patient including but not limited to bleeding, infection, nerve damage, paralysis, failed block, incomplete pain control, headache, blood pressure changes, nausea, vomiting, reactions to medication both or allergic, itching and postpartum back pain. Confirmed with bedside nurse the patient's most recent platelet count. Confirmed with patient that they are not currently taking any anticoagulation, have any bleeding history or any family history of bleeding disorders. Patient expressed understanding and wished to proceed. All questions were answered. Sterile technique was used throughout the entire procedure. Please see nursing notes for vital signs. Test dose was given through epidural needle and negative prior to continuing to dose epidural or start infusion. Warning signs of high block given to the patient including shortness of breath, tingling/numbness in hands, complete motor block, or any concerning symptoms with instructions to call for help. Patient was given  instructions on fall risk and not to get out of bed. All questions and concerns addressed with instructions to call with any issues.     

## 2017-11-25 NOTE — Anesthesia Preprocedure Evaluation (Signed)

## 2017-11-25 NOTE — Anesthesia Pain Management Evaluation Note (Signed)
  CRNA Pain Management Visit Note  Patient: Christine [name], 26 y.o., female  "Hello I am a member of the anesthesia team at [facility]. We have an anesthesia team available at all times to provide care throughout the hospital, including epidural management and anesthesia for C-section. I don't know your plan for the delivery whether it a natural birth, water birth, IV sedation, nitrous supplementation, doula or epidural, but we want to meet your pain goals."   1.Was your pain managed to your expectations on prior hospitalizations?   No prior hospitalizations  2.What is your expectation for pain management during this hospitalization?     Epidural  3.How can we help you reach that goal? Epidural in place  Record the patient's initial score and the patient's pain goal.   Pain: 3 pressure sensations  Pain Goal: 5 [facility] wants you to be able to say your pain was always managed very well.  [name] 11/25/2017

## 2017-11-25 NOTE — Progress Notes (Signed)
[facility] Aysser [phone] on speaker phone to assist with interpretation as needed

## 2017-11-26 LAB — CBC
HCT: 33.7 % — ABNORMAL LOW (ref 36.0–46.0)
HEMOGLOBIN: 11.1 g/dL — AB (ref 12.0–15.0)
MCH: 30.5 pg (ref 26.0–34.0)
MCHC: 32.9 g/dL (ref 30.0–36.0)
MCV: 92.6 fL (ref 78.0–100.0)
Platelets: 179 10*3/uL (ref 150–400)
RBC: 3.64 MIL/uL — AB (ref 3.87–5.11)
RDW: 13.7 % (ref 11.5–15.5)
WBC: 12.6 10*3/uL — ABNORMAL HIGH (ref 4.0–10.5)

## 2017-11-26 LAB — RPR: RPR: REACTIVE — AB

## 2017-11-26 LAB — RPR, QUANT+TP ABS (REFLEX): T Pallidum Abs: NEGATIVE

## 2017-11-26 NOTE — Progress Notes (Signed)
Patient is eating, ambulating, voiding.  Pain control is good.  Appropriate lochia, no complaints.  Vitals:   11/26/17 0035 11/26/17 0630 11/26/17 0940 11/26/17 1758  BP:  109/75 114/71 105/63  Pulse:  84 75 89  Resp:  18 16 16   Temp: 97.8 F (36.6 C) 97.9 F (36.6 C) 98 F (36.7 C) 98.3 F (36.8 C)  TempSrc: Oral Oral Oral Oral  SpO2:      Weight:      Height:        Fundus firm Ext: no calf tenderness  Lab Results  Component Value Date   WBC 12.6 (H) 11/26/2017   HGB 11.1 (L) 11/26/2017   HCT 33.7 (L) 11/26/2017   MCV 92.6 11/26/2017   PLT 179 11/26/2017    --/--/B POS (12/11 1015)  A/P Post partum day 1 circ in office  Routine care.  Expect d/c 12/13    [place], [place]

## 2017-11-26 NOTE — Anesthesia Postprocedure Evaluation (Signed)
Anesthesia Post Note  Patient: Agricultural consultantAmal Hargan  Procedure(s) Performed: AN AD HOC LABOR EPIDURAL     Patient location during evaluation: Mother Baby Anesthesia Type: Epidural Level of consciousness: awake and alert and oriented Pain management: satisfactory to patient Vital Signs Assessment: post-procedure vital signs reviewed and stable Respiratory status: spontaneous breathing and nonlabored ventilation Cardiovascular status: stable Postop Assessment: no headache, no backache, no signs of nausea or vomiting, adequate PO intake and patient able to bend at knees (patient up walking) Anesthetic complications: no    Last Vitals:  Vitals:   11/26/17 0035 11/26/17 0630  BP:  109/75  Pulse:  84  Resp:  18  Temp: 36.6 C 36.6 C  SpO2:      Last Pain:  Vitals:   11/26/17 0630  TempSrc: Oral  PainSc: 4    Pain Goal: Patients Stated Pain Goal: 0 (11/25/17 0813)               Madison HickmanGREGORY,Farouk Vivero

## 2017-11-26 NOTE — Lactation Note (Signed)
This note was copied from a baby's chart. Lactation Consultation Note  Patient Name: Boy Miki Kinsmal Gallus WUJWJ'XToday's Date: 11/26/2017 Reason for consult: Initial assessment;Early term 37-38.6wks;1st time breastfeeding;Infant < 6lbs  Initial visit at 19 hours of age.   Baby is 7460w3d 5#5.7oz baby has not established good feedings yet.  [facility] Luna FuseEttefagh [facility] may need a DEBP with supplement. Chart indicated a NS was started. Mom speaks native Arabic, but reports understanding this LC.   Mom reports recent feeding attempt of 15 minutes with maybe 5 minutes of sucking.  Mom was able to hand express a few drops.   Baby just received bath and tech placed baby STS.  Mom attempted to latch baby in cradle hold.  Baby latched with lower lip curled in and narrow gape. LC showed mom to hold baby in cross cradle and wait for wide open mouth.  Baby has few sucking burst with stimulation for several minutes.   Mom reports some discomfort with latch.  Baby then rested on mom STS.  LC attempted hand expression with mom who is unable to tolerate due to pain.  LC set up DEBP with instructions to pump for stimulation to help increase supply.   LC discussed early feeding cues and need to wake baby every 2 1/2 hours to offer baby feedings if baby remains sleepy.   LC shared with parents written instructions for "late preterm feeding" and discussed may apply to this baby due to weight and feedings so far. Mom reports she does read english and reviewed written instructions. Lc discussed allowing mom to pump after 1 hour of STS to collect EBM prior to use of formula.  Parents understand that peds recommends use of formula if mom is not collecting EBM.   LC encouraged mom to use pump on initiate phase and then try to do hand expression to express drops to apply to baby's mouth.  Parents aware a spoon can be used if she collects more or if formula is needed. Mom to call RN for assist as needed.  Lc to follow tomorrow.      Maternal Data Has patient been taught Hand Expression?: Yes Does the patient have breastfeeding experience prior to this delivery?: No  Feeding Feeding Type: Breast Fed Length of feed: (few minutes)  LATCH Score Latch: Grasps breast easily, tongue down, lips flanged, rhythmical sucking.  Audible Swallowing: A few with stimulation  Type of Nipple: Everted at rest and after stimulation  Comfort (Breast/Nipple): Soft / non-tender  Hold (Positioning): Assistance needed to correctly position infant at breast and maintain latch.  LATCH Score: 8  Interventions Interventions: Breast feeding basics reviewed;Assisted with latch;Support pillows;Adjust position;DEBP;Skin to skin;Breast massage;Expressed milk;Hand express  Lactation Tools Discussed/Used WIC Program: No(want to contact WIC) Pump Review: Setup, frequency, and cleaning Initiated by:: JS IBCLC Date initiated:: 11/26/17   Consult Status Consult Status: Follow-up Date: 11/27/17 Follow-up type: In-patient    Franz DellJana Zeppelin Commisso 11/26/2017, 12:35 PM

## 2017-11-27 ENCOUNTER — Ambulatory Visit: Payer: Self-pay

## 2017-11-27 NOTE — Progress Notes (Addendum)
Patient is eating, ambulating, voiding.  Pain control is good.  Vitals:   11/26/17 0630 11/26/17 0940 11/26/17 1758 11/27/17 0612  BP: 109/75 114/71 105/63 94/62  Pulse: 84 75 89 75  Resp: 18 16 16 18   Temp: 97.9 F (36.6 C) 98 F (36.7 C) 98.3 F (36.8 C) 97.8 F (36.6 C)  TempSrc: Oral Oral Oral Axillary  SpO2:      Weight:      Height:        Fundus firm Perineum without swelling.  Lab Results  Component Value Date   WBC 12.6 (H) 11/26/2017   HGB 11.1 (L) 11/26/2017   HCT 33.7 (L) 11/26/2017   MCV 92.6 11/26/2017   PLT 179 11/26/2017    --/--/B POS (12/11 1015)/RI  A/P Post partum day I.  Routine care.  Expect d/c today.  RPR pos but confirm test was NEG.  Caran Storck A

## 2017-11-27 NOTE — Lactation Note (Signed)
This note was copied from a baby's chart. Lactation Consultation Note  Patient Name: Christine [name] JWJXB'JToday's Date: 11/27/2017 Reason for consult: Follow-up assessment;Early term 37-38.6wks;MD order;1st time breastfeeding;Primapara;Infant weight loss;Infant < 6lbs   Follow-up consult for baby-pt 37.3 early term <6 lbs acting like LPTI.  Mom is P1. Arabic speaking but FOB speaks some [place] and interprets for mom.   GA 37.3; BW 5 lbs, 7.8 oz; 7% weight loss.  Mom was given NS last night.   Infant has breastfed x8 (20-40 min) + attempts x6 (0-5 min); voids=3 in 24 hrs/ 4 life; stools-4 in 24 hrs/ 5 life.  RN gave LS-8 but wrote in note that baby had on/off pattern. FOB stated baby had been sleeping for 4 hr time frames and would suck at breast but not consistently throughout feeding.   When LC entered room infant on breast, very sleepy, would take 1-2 sucks before longer rest periods.  Only one swallow heard in a 10 minute observation period.  Patient was wearing a nipple shield, no colostrum visible in nipple shield when infant came off.  When infant would come off during the feeding, was fussy. Mom has everted nipples and compressible areolas.   Discussed how early term babies can display LPTI behaviors with parents and discussed the need for additional supplementation d/t weight loss at 7% and not having sustained rhythmical sucking at breast.   Discussed giving EBM and/or formula to meet day of life guidelines for supplementation amounts.  Parents consented.  Discussed giving supplementation at breast using 5 [place] SNS to keep baby at breast.   Peds MD had written in note from yesterday afternoon, that if feedings did not progress, then to supplement with 22 kcal Neosure. LC left room to gather supplies.  Started SNS with 10 ml formula.  Infant easily latched without nipple shield and pulled formula out of syringe himself with his sucking pressure and pattern. With SNS, sucking pattern was  sustained and rhythmical.  Gave an additional 5 ml and infant easily consumed the 5 ml.  After 15 minutes of feeding with SNS, infant came off breast and went to sleep consuming a total of 15 ml of formula with LS- 9 with SNS.  Taught FOB how to start and clean all 5 [place] SNS pieces.   LC then started mom pumping.  Mom had not pumped at all.  Reviewed instructions for pumping on "initiate" setting, turning dial up to 3-4 circles, and hand expressing at end of pumping session.  Teach-back from FOB how to operate pump.  Re-taught hand expression with return demonstration and few drops collected.  Gave curved-tip syringe and colostrum collection containers.   LC left room while mom pumping [facility]  [facility] who were in nursery about pt's needs.   Pump needs for discharge will need to be re-assessed tomorrow prior to discharge.   Upon returning mom had pumped ~2 ml colostrum which was stored in colostrum collection container.      Language barrier but FOB can understand English with repetition and teach-back. LC wrote plan of care on the back of parents LPTI Guideline Sheets. FOB wanted LC to be very specific with time frames for supplementation and amounts.  All info requested written down for FOB to follow. LC assured understanding with teach-back from FOB.    Encouraged parents to call at next latching for assistance from RN as needed.   Encouraged  to continue feeding with feeding cues, but to supplement with EBM/formula every 2.5-3 hrs.    PLAN OF CARE 1.  Pump using DEBP on "initiate" setting with 3-4 circles after breastfeeding infant. 2.  Hand express for an additional 10 minutes after pumping.  Save colostrum collected for next feeding. 3.  Feed colostrum to infant first prior to feeding formula either using 5 [place] SNS at breast or curved-tip syringe. 4.  Feed formula using day of life guidelines (subtracting  amount of EBM given) and increasing amount gradually based on baby's needs.   4.  Pump after each feeding.    LC explained that as EBM amount increases, then amount of formula needed will decrease.   Infant's weight will be reassessed tomorrow prior to discharge. Discharge and Feeding Plan of Care (supplementation amounts, etc...) will need to be reviewed with parents prior to discharge to assure understanding and to help FOB feel confident prior to discharge about the need to continue supplementation including length of time needed for continued supplementation. LC may need to consider making OP consult for parents prior to discharge.    Maternal Data Has patient been taught Hand Expression?: Yes(reviewed with patient; patient could not remember how to hand express) Does the patient have breastfeeding experience prior to this delivery?: No  Feeding Feeding Type: Breast Fed Length of feed: 15 min  LATCH Score Latch: Grasps breast easily, tongue down, lips flanged, rhythmical sucking.(with SNS and formula in SNS)  Audible Swallowing: Spontaneous and intermittent(with SNS)  Type of Nipple: Everted at rest and after stimulation  Comfort (Breast/Nipple): Soft / non-tender  Hold (Positioning): Assistance needed to correctly position infant at breast and maintain latch.  LATCH Score: 9  Interventions Interventions: Breast feeding basics reviewed;Assisted with latch;Skin to skin;Breast massage;Hand express;Breast compression;Adjust position;DEBP;Expressed milk;Position options;Support pillows  Lactation Tools Discussed/Used WIC Program: No[facility]   Consult Status Consult Status: Follow-up Date: 11/28/17 Follow-up type: In-patient    Christine [name], Christine [name] 11/27/2017, 1:33 PM

## 2017-11-27 NOTE — Progress Notes (Signed)
DC teaching went over with dad to give to mom . Meds explained as well as what being a baby patient means. They both understand. Sitz given to mom per order.

## 2017-11-27 NOTE — Discharge Summary (Signed)
Obstetric Discharge Summary Reason for Admission: rupture of membranes Prenatal Procedures: none Intrapartum Procedures: spontaneous vaginal delivery Postpartum Procedures: none Complications-Operative and Postpartum: 2 degree perineal laceration Hemoglobin  Date Value Ref Range Status  11/26/2017 11.1 (L) 12.0 - 15.0 g/dL Final   HCT  Date Value Ref Range Status  11/26/2017 33.7 (L) 36.0 - 46.0 % Final    Discharge Diagnoses: Term Pregnancy-delivered  Discharge Information: Date: 11/27/2017 Activity: pelvic rest Diet: routine Medications: Ibuprofen Condition: stable Instructions: refer to practice specific booklet Discharge to: home Follow-up Information    Levi AlandAnderson, Mark E, MD Follow up in 4 week(s).   Specialty:  Obstetrics and Gynecology Contact information: [address] [place] [id number] [phone]           Newborn Data: Live born female  Birth Weight: 5 lb 7.8 oz (2490 g) APGAR: 8, 9  Newborn Delivery   Birth date/time:  11/25/2017 17:29:00 Delivery type:  Vaginal, Spontaneous     Home with mother.  Shahla Betsill A 11/27/2017, 7:51 AM

## 2017-11-28 ENCOUNTER — Ambulatory Visit: Payer: Self-pay

## 2017-11-28 NOTE — Lactation Note (Signed)
This note was copied from a baby's chart. Lactation Consultation Note  Patient Name: Christine [name] WUJWJ'XToday's Date: 11/28/2017 Reason for consult: Follow-up assessment;Infant < 6lbs;1st time breastfeeding;Early term 37-38.6wks   Follow up with mom of 66 hour old infant. Dad served as Equities traderinterpreter. Infant with 9 BF for 10-15 minutes, EBM x 5 3-15 cc using the 5 fr feeding tube, formula x 8 using 5 fr feeding tube at the breast 15-25 cc, 7 voids and 7 stools in last 24 hours. Infant weight 5 lb 3.5 oz with 5% weight (infant with weight gain in the last 24 hours). LATCH scores 7-9.   Dad reports infant is feeding at the breast with the 5 fr feeding tube. Parents report infant is pulling the milk through the syringe by himself. Parents give EBM first and then add formula as needed. Reviewed supplementation amounts based on day of age and increasing amounts each day. Parents aware to give EBM and add formula as needed to make up supplement amounts.   Dad reports infant is feeding often. They are allowing infant to eat and then fall asleep and if he does not finish the supplement they allow him to wake up in about 30 minutes and feed again. Discussed importance of BF with 5 fr feeding tube/SNS and then finish supplementation via finger feeding. Discussed importance of finishing feeding at feeding time and not to allow infant to feed every 30 minutes for caloric reasons. Discussed with dad that if infant not willing to supplement vis finger feeding, to feed infant with a bottle.  Dad voiced understanding.   Infant was latched to the breast in the cradle position, he was sleeping. Parents were not using the 5 fr feeding tube at the time. [facility] and when returned infant was being held and rocked by dad and mom was pumping. Dad then attempted to put infant to sleep but infant still crying. Set up SNS at the breast and assisted mom with feeding, infant fed well with the SNS needing some  stimulation to maintain suckling. Infant took entire 30 cc of supplement (10 cc EBM/20 cc Neocare 22 cal) via SNS. Discussed clean up and assembling of SNS.   Reviewed I/O, Engorgement prevention/treatment, signs of dehydration in the NB, and breast milk expression and storage.   WIC pump loaner completed. [facility] [place] [facility] and schedule an OP appt for next week. Infant with follow up Ped visit tomorrow morning.        Maternal Data Formula Feeding for Exclusion: No Has patient been taught Hand Expression?: Yes Does the patient have breastfeeding experience prior to this delivery?: No  Feeding Feeding Type: Breast Fed Length of feed: 10 min  LATCH Score                   Interventions    Lactation Tools Discussed/Used WIC Program: Yes Pump Review: Setup, frequency, and cleaning;Milk Storage Initiated by:: [name], RN IBCLC   Consult Status Consult Status: Follow-up Date: 11/29/17 Follow-up type: In-patient    Christine [name] 11/28/2017, 12:54 PM

## 2019-11-22 ENCOUNTER — Other Ambulatory Visit: Payer: Self-pay

## 2019-11-22 DIAGNOSIS — Z20822 Contact with and (suspected) exposure to covid-19: Secondary | ICD-10-CM

## 2019-11-24 ENCOUNTER — Telehealth: Payer: Self-pay

## 2019-11-24 LAB — NOVEL CORONAVIRUS, NAA: SARS-CoV-2, NAA: NOT DETECTED

## 2019-11-24 NOTE — Telephone Encounter (Signed)
Patient's spouse, Frances Maywood, called in requesting [place] lab results and MyChart setup - advised I needed to speak to the patient to obtain verbal consent to speak to him - spoke to patient - DOB/Address verified, obtained verbal consent to speak to spouse - Negative results given. Assisted with MyChart setup, no further questions.

## 2019-11-27 ENCOUNTER — Encounter (HOSPITAL_COMMUNITY): Payer: Self-pay | Admitting: Obstetrics and Gynecology

## 2019-11-27 ENCOUNTER — Emergency Department (HOSPITAL_COMMUNITY)
Admission: EM | Admit: 2019-11-27 | Discharge: 2019-11-28 | Discharge status: Against Medical Advice | Payer: Medicaid Other | Attending: Emergency Medicine | Admitting: Emergency Medicine

## 2019-11-27 ENCOUNTER — Other Ambulatory Visit: Payer: Self-pay

## 2019-11-27 DIAGNOSIS — R42 Dizziness and giddiness: Secondary | ICD-10-CM

## 2019-11-27 DIAGNOSIS — Z5329 Procedure and treatment not carried out because of patient's decision for other reasons: Secondary | ICD-10-CM | POA: Insufficient documentation

## 2019-11-27 DIAGNOSIS — R519 Headache, unspecified: Secondary | ICD-10-CM | POA: Diagnosis present

## 2019-11-27 DIAGNOSIS — R5383 Other fatigue: Secondary | ICD-10-CM | POA: Insufficient documentation

## 2019-11-27 HISTORY — DX: Anemia, unspecified: D64.9

## 2019-11-27 MED ORDER — SODIUM CHLORIDE 0.9% FLUSH: 3.0000 mL | Freq: Once | INTRAVENOUS | Status: DC

## 2019-11-27 NOTE — ED Notes (Signed)
Requested patient to change out.

## 2019-11-27 NOTE — ED Triage Notes (Signed)
Pt reports she has been having a headache and fatigue x2 days. Patient denies N/V/D, chest pain, or SOB.

## 2019-11-28 MED ORDER — SODIUM CHLORIDE 0.9 % IV BOLUS: 1000.0000 mL | Freq: Once | INTRAVENOUS | Status: DC

## 2019-11-28 NOTE — ED Notes (Signed)
Patient verbalized that she was upset because she had to wait for "so long" out in the lobby and there were other people being seen before her. Explained to patient that we have to triage and treat patients based off of acuity and severity and that we apologize for the wait but we are doing the best that we can and will take the best care of her that we can.

## 2019-11-28 NOTE — ED Provider Notes (Signed)
[facility] DEPT Provider Note   CSN: [id number] Arrival date & time: 11/27/19  2033     History Chief Complaint  Patient presents with  . Headache  . Fatigue    Christine [name] is a 28 y.o. female.  Patient to ED with complaint of dizziness and feeling like she is off balance when she walks. Symptoms x 2 days. She has a mild headache. No fever, congestion, cough. She reports being tested at [facility] for [place] 2 days ago that was negative. No nausea, vomiting, significant pain, fever, SOB. She is currently on her normal menses and reports a history of anemia. She is taking iron supplements. No history of transfusion.   The history is provided by the patient. No language interpreter was used.  Headache Associated symptoms: dizziness   Associated symptoms: no fever        Past Medical History:  Diagnosis Date  . Anemia     There are no problems to display for this patient.   History reviewed. No pertinent surgical history.   OB History    Gravida      Para      Term      Preterm      AB      Living  1     SAB      TAB      Ectopic      Multiple      Live Births              No family history on file.  Social History   Tobacco Use  . Smoking status: Never Smoker  . Smokeless tobacco: Never Used  Substance Use Topics  . Alcohol use: Not Currently  . Drug use: Not Currently    Home Medications Prior to Admission medications   Not on File    Allergies    Patient has no allergy information on record.  Review of Systems   Review of Systems  Constitutional: Negative for chills and fever.  HENT: Negative.   Respiratory: Negative.   Cardiovascular: Negative.   Gastrointestinal: Negative.   Musculoskeletal: Negative.   Skin: Negative.   Neurological: Positive for dizziness and headaches.    Physical Exam Updated Vital Signs BP 127/77 (BP Location: Left Arm)   Pulse 82   Temp 98.3 F (36.8 C)  (Oral)   Resp 18   Ht 5' 3.78" (1.62 m)   Wt 51 kg   LMP 11/21/2019   SpO2 100%   BMI 19.43 kg/m   Physical Exam Vitals and nursing note reviewed.  Constitutional:      Appearance: She is well-developed.  HENT:     Head: Normocephalic.  Eyes:     Comments: No conjunctival pallor.  Cardiovascular:     Rate and Rhythm: Normal rate and regular rhythm.  Pulmonary:     Effort: Pulmonary effort is normal.     Breath sounds: Normal breath sounds. No wheezing, rhonchi or rales.  Abdominal:     General: Bowel sounds are normal.     Palpations: Abdomen is soft.     Tenderness: There is no abdominal tenderness. There is no guarding or rebound.  Musculoskeletal:        General: Normal range of motion.     Cervical back: Normal range of motion and neck supple.  Skin:    General: Skin is warm and dry.     Findings: No rash.  Neurological:     Mental  Status: She is alert.     GCS: GCS eye subscore is 4. GCS verbal subscore is 5. GCS motor subscore is 6.     Cranial Nerves: No dysarthria or facial asymmetry.     Sensory: No sensory deficit.     Motor: No weakness.     Gait: Gait normal.     ED Results / Procedures / Treatments   Labs (all labs ordered are listed, but only abnormal results are displayed) Labs Reviewed  BASIC METABOLIC PANEL  CBC  URINALYSIS, ROUTINE W REFLEX MICROSCOPIC  CBG MONITORING, ED  I-STAT BETA HCG BLOOD, ED ([facility], [facility], [facility] ONLY)    EKG None  Radiology No results found.  Procedures Procedures (including critical care time)  Medications Ordered in ED Medications  sodium chloride flush (NS) 0.9 % injection 3 mL (has no administration in time range)  sodium chloride 0.9 % bolus 1,000 mL (has no administration in time range)    ED Course  I have reviewed the triage vital signs and the nursing notes.  Pertinent labs & imaging results that were available during my care of the patient were reviewed by me and considered in my medical decision making  (see chart for details).    MDM Rules/Calculators/A&P     CHA2DS2/VAS Stroke Risk Points      N/A >= 2 Points: High Risk  1 - 1.99 Points: Medium Risk  0 Points: Low Risk    A final score could not be computed because of missing components.: Last  Change: N/A     This score determines the patient's risk of having a stroke if the  patient has atrial fibrillation.      This score is not applicable to this patient. Components are not  calculated.                   Patient to ED with complaints as detailed in the HPI.  She expresses discontent with having to wait 3 hours to be seen. Tried to explain the reason behind the wait.   Orders placed in the patient's best interest to evaluate symptoms but she refused all testing, further vitals and leaves the department prior to any test result or diagnosis.   Final Clinical Impression(s) / ED Diagnoses Final diagnoses:  None   1. Dizziness  Rx / DC Orders ED Discharge Orders    None       Elpidio Anis, PA-C 11/28/19 0109    Melene Plan, DO 12/07/19 [phone]

## 2019-11-28 NOTE — ED Notes (Signed)
Pt called out for nurse and stated that she wanted to leave because her baby was at home crying. This nurse advised the pt that she hasn't been fully evaluated by a physician and so she would be leaving AMA and without being cleared medically and the risks associated with that. Patient verbalized that she wanted to leave and dressed herself and walked out unassisted without incident. EDAAP and EDP notified.

## 2019-11-29 ENCOUNTER — Encounter (HOSPITAL_COMMUNITY): Payer: Self-pay | Admitting: *Deleted

## 2020-05-20 ENCOUNTER — Other Ambulatory Visit: Payer: Self-pay

## 2020-05-20 ENCOUNTER — Encounter (HOSPITAL_COMMUNITY): Payer: Self-pay | Admitting: Obstetrics and Gynecology

## 2020-05-20 ENCOUNTER — Inpatient Hospital Stay (HOSPITAL_COMMUNITY)
Admission: AD | Admit: 2020-05-20 | Discharge: 2020-05-20 | Disposition: A | Discharge status: Home / Self Care | Payer: Medicaid Other | Attending: Obstetrics and Gynecology | Admitting: Obstetrics and Gynecology

## 2020-05-20 ENCOUNTER — Inpatient Hospital Stay (HOSPITAL_COMMUNITY): Payer: Medicaid Other

## 2020-05-20 DIAGNOSIS — O418X10 Other specified disorders of amniotic fluid and membranes, first trimester, not applicable or unspecified: Secondary | ICD-10-CM

## 2020-05-20 DIAGNOSIS — O208 Other hemorrhage in early pregnancy: Secondary | ICD-10-CM | POA: Insufficient documentation

## 2020-05-20 DIAGNOSIS — Z3A08 8 weeks gestation of pregnancy: Secondary | ICD-10-CM | POA: Diagnosis not present

## 2020-05-20 DIAGNOSIS — O468X1 Other antepartum hemorrhage, first trimester: Secondary | ICD-10-CM

## 2020-05-20 DIAGNOSIS — O209 Hemorrhage in early pregnancy, unspecified: Secondary | ICD-10-CM

## 2020-05-20 DIAGNOSIS — Z3A01 Less than 8 weeks gestation of pregnancy: Secondary | ICD-10-CM | POA: Insufficient documentation

## 2020-05-20 LAB — CBC
HCT: 35.7 % — ABNORMAL LOW (ref 36.0–46.0)
Hemoglobin: 11.4 g/dL — ABNORMAL LOW (ref 12.0–15.0)
MCH: 27.9 pg (ref 26.0–34.0)
MCHC: 31.9 g/dL (ref 30.0–36.0)
MCV: 87.3 fL (ref 80.0–100.0)
Platelets: 310 10*3/uL (ref 150–400)
RBC: 4.09 MIL/uL (ref 3.87–5.11)
RDW: 15.4 % (ref 11.5–15.5)
WBC: 7.1 10*3/uL (ref 4.0–10.5)
nRBC: 0 % (ref 0.0–0.2)

## 2020-05-20 LAB — COMPREHENSIVE METABOLIC PANEL
ALT: 14 U/L (ref 0–44)
AST: 17 U/L (ref 15–41)
Albumin: 4.1 g/dL (ref 3.5–5.0)
Alkaline Phosphatase: 60 U/L (ref 38–126)
Anion gap: 13 (ref 5–15)
BUN: <5 mg/dL — ABNORMAL LOW (ref 6–20)
CO2: 23 mmol/L (ref 22–32)
Calcium: 10.1 mg/dL (ref 8.9–10.3)
Chloride: 101 mmol/L (ref 98–111)
Creatinine, Ser: 0.46 mg/dL (ref 0.44–1.00)
GFR calc Af Amer: >60 mL/min (ref >60)
GFR calc non Af Amer: >60 mL/min (ref >60)
Glucose, Bld: 97 mg/dL (ref 70–99)
Potassium: 4.8 mmol/L (ref 3.5–5.1)
Sodium: 137 mmol/L (ref 135–145)
Total Bilirubin: 0.7 mg/dL (ref 0.3–1.2)
Total Protein: 7.8 g/dL (ref 6.5–8.1)

## 2020-05-20 LAB — URINALYSIS, ROUTINE W REFLEX MICROSCOPIC
Bilirubin Urine: NEGATIVE
Glucose, UA: NEGATIVE mg/dL
Ketones, ur: NEGATIVE mg/dL
Nitrite: NEGATIVE
Protein, ur: NEGATIVE mg/dL
Specific Gravity, Urine: 1.003 — ABNORMAL LOW (ref 1.005–1.030)
pH: 7 (ref 5.0–8.0)

## 2020-05-20 LAB — POCT PREGNANCY, URINE: Preg Test, Ur: POSITIVE — AB

## 2020-05-20 LAB — HCG, QUANTITATIVE, PREGNANCY: hCG, Beta Chain, Quant, S: 26544 m[IU]/mL — ABNORMAL HIGH (ref <5)

## 2020-05-20 NOTE — MAU Provider Note (Signed)
Chief Complaint: Back Pain and Vaginal Bleeding   First Provider Initiated Contact with Patient 05/20/20 1618     Video interpreter used.  SUBJECTIVE HPI: Christine [name] is a 29 y.o. G2P1001 at [redacted]w[redacted]d who presents to maternity admissions reporting bleeding with wiping each time for the last 2 days. Reports just spotting. Sometimes has pain in her lower back in the last two days; no pain currently. She has not taken anything for pain. She has not been using a panty liner or pad as it is not enough bleeding to reach underwear. Denies complications with your first pregnancy. Denies concerns for STDs.  She denies vaginal itching/burning, urinary symptoms, h/a, dizziness, n/v, or fever/chills.    Past Medical History:  Diagnosis Date   Anemia    Medical history non-contributory    Past Surgical History:  Procedure Laterality Date   NO PAST SURGERIES     Social History   Socioeconomic History   Marital status: Married    Spouse name: Not on file   Number of children: Not on file   Years of education: Not on file   Highest education level: Not on file  Occupational History   Not on file  Tobacco Use   Smoking status: Never Smoker   Smokeless tobacco: Never Used  Substance and Sexual Activity   Alcohol use: Not Currently   Drug use: Not Currently   Sexual activity: Yes  Other Topics Concern   Not on file  Social History Narrative   ** Merged History Encounter **       Social Determinants of Health   Financial Resource Strain:    Difficulty of Paying Living Expenses:   Food Insecurity:    Worried About Programme researcher, broadcasting/film/video in the Last Year:    Barista in the Last Year:   Transportation Needs:    Freight forwarder (Medical):    Lack of Transportation (Non-Medical):   Physical Activity:    Days of Exercise per Week:    Minutes of Exercise per Session:   Stress:    Feeling of Stress :   Social Connections:    Frequency of Communication with  Friends and Family:    Frequency of Social Gatherings with Friends and Family:    Attends Religious Services:    Active Member of Clubs or Organizations:    Attends Engineer, structural:    Marital Status:   Intimate Partner Violence:    Fear of Current or Ex-Partner:    Emotionally Abused:    Physically Abused:    Sexually Abused:    No current facility-administered medications on file prior to encounter.   Current Outpatient Medications on File Prior to Encounter  Medication Sig Dispense Refill   ferrous sulfate 325 (65 FE) MG tablet Take 325 mg by mouth 3 (three) times daily with meals.      Prenatal Vit-Fe Fumarate-FA (PRENATAL MULTIVITAMIN) TABS tablet Take 1 tablet daily at 12 noon by mouth.     No Known Allergies  ROS:  Review of Systems All other systems negative unless noted above in HPI.   I have reviewed patient's Past Medical Hx, Surgical Hx, Family Hx, Social Hx, medications and allergies.   Physical Exam   Patient Vitals for the past 24 hrs:  BP Temp Temp src Pulse Resp SpO2 Height Weight  05/20/20 1559 111/72 99 F (37.2 C) Oral 87 16 100 % 5' 0.63" (1.54 m) 52 kg   Constitutional: Well-developed, well-nourished female  in no acute distress.  Cardiovascular: normal rate Respiratory: normal effort GI: Abd soft, non-tender.  MS: Extremities nontender, no edema, normal ROM Neurologic: Alert and oriented x 4.  Psych: Normal mood and affect PELVIC EXAM: Cervix pink, visually closed, without lesion, scant white creamy discharge, vaginal walls and external genitalia normal; scant brownish discharge at cervical os Bimanual exam: Cervix 0/long/high, firm, anterior, neg CMT, uterus nontender, nonenlarged, adnexa without tenderness, enlargement, or mass  Dilation: Closed Exam by:: Barrington Ellison MD  LAB RESULTS Results for orders placed or performed during the hospital encounter of 05/20/20 (from the past 24 hour(s))  Urinalysis, Routine w reflex  microscopic     Status: Abnormal   Collection Time: 05/20/20  3:53 PM  Result Value Ref Range   Color, Urine STRAW (A) YELLOW   APPearance HAZY (A) CLEAR   Specific Gravity, Urine 1.003 (L) 1.005 - 1.030   pH 7.0 5.0 - 8.0   Glucose, UA NEGATIVE NEGATIVE mg/dL   Hgb urine dipstick LARGE (A) NEGATIVE   Bilirubin Urine NEGATIVE NEGATIVE   Ketones, ur NEGATIVE NEGATIVE mg/dL   Protein, ur NEGATIVE NEGATIVE mg/dL   Nitrite NEGATIVE NEGATIVE   Leukocytes,Ua MODERATE (A) NEGATIVE   RBC / HPF 0-5 0 - 5 RBC/hpf   WBC, UA 21-50 0 - 5 WBC/hpf   Bacteria, UA RARE (A) NONE SEEN   Squamous Epithelial / LPF 6-10 0 - 5  Pregnancy, urine POC     Status: Abnormal   Collection Time: 05/20/20  3:53 PM  Result Value Ref Range   Preg Test, Ur POSITIVE (A) NEGATIVE  CBC     Status: Abnormal   Collection Time: 05/20/20  4:31 PM  Result Value Ref Range   WBC 7.1 4.0 - 10.5 K/uL   RBC 4.09 3.87 - 5.11 MIL/uL   Hemoglobin 11.4 (L) 12.0 - 15.0 g/dL   HCT 35.7 (L) 36.0 - 46.0 %   MCV 87.3 80.0 - 100.0 fL   MCH 27.9 26.0 - 34.0 pg   MCHC 31.9 30.0 - 36.0 g/dL   RDW 15.4 11.5 - 15.5 %   Platelets 310 150 - 400 K/uL   nRBC 0.0 0.0 - 0.2 %  Comprehensive metabolic panel     Status: Abnormal   Collection Time: 05/20/20  4:31 PM  Result Value Ref Range   Sodium 137 135 - 145 mmol/L   Potassium 4.8 3.5 - 5.1 mmol/L   Chloride 101 98 - 111 mmol/L   CO2 23 22 - 32 mmol/L   Glucose, Bld 97 70 - 99 mg/dL   BUN <5 (L) 6 - 20 mg/dL   Creatinine, Ser 0.46 0.44 - 1.00 mg/dL   Calcium 10.1 8.9 - 10.3 mg/dL   Total Protein 7.8 6.5 - 8.1 g/dL   Albumin 4.1 3.5 - 5.0 g/dL   AST 17 15 - 41 U/L   ALT 14 0 - 44 U/L   Alkaline Phosphatase 60 38 - 126 U/L   Total Bilirubin 0.7 0.3 - 1.2 mg/dL   GFR calc non Af Amer >60 >60 mL/min   GFR calc Af Amer >60 >60 mL/min   Anion gap 13 5 - 15  hCG, quantitative, pregnancy     Status: Abnormal   Collection Time: 05/20/20  4:31 PM  Result Value Ref Range   hCG, Beta  Chain, Quant, S 26,544 (H) <5 mIU/mL       IMAGING US OB LESS THAN 14 WEEKS WITH OB TRANSVAGINAL  Result Date: 05/20/2020  CLINICAL DATA:  Vaginal bleeding EXAM: OBSTETRIC <14 WK [place] AND TRANSVAGINAL OB US TECHNIQUE: Both transabdominal and transvaginal ultrasound examinations were performed for complete evaluation of the gestation as well as the maternal uterus, adnexal regions, and pelvic cul-de-sac. Transvaginal technique was performed to assess early pregnancy. COMPARISON:  None. FINDINGS: Intrauterine gestational sac: Single Yolk sac:  Visualized. Embryo:  Visualized. Cardiac Activity: Not Visualized. CRL:  2.9 mm   5 w   5 d                  [place] EDC: 01/15/2021 Subchorionic hemorrhage:  Small subchorionic hemorrhage visualized. Maternal uterus/adnexae: Left ovary measures 2.4 x 1.3 by 2.1 cm and the right ovary measures 4.1 x 3.8 x 4.0 cm. There is a simple appearing 3.7 x 3.3 x 3.7 cm cyst identified within the right ovary. No free fluid. IMPRESSION: 1. Single intrauterine pregnancy as above, estimated age 32 weeks and 5 days based on crown-rump length measurement. Cardiac activity is not yet documented, likely due to early gestational age. Short interval follow-up ultrasound recommended to document viable pregnancy. 2. Small subchorionic hemorrhage. 3. Simple appearing right ovarian cyst. Electronically Signed   By: Sharlet Salina M.D.   On: 05/20/2020 17:51    MAU Management/MDM: Orders Placed This Encounter  Procedures   OB Urine Culture   US OB LESS THAN 14 WEEKS WITH OB TRANSVAGINAL   Urinalysis, Routine w reflex microscopic   CBC   Comprehensive metabolic panel   hCG, quantitative, pregnancy   Pregnancy, urine POC   Discharge patient    No orders of the defined types were placed in this encounter.    ASSESSMENT 1. Bleeding in early pregnancy   2. Vaginal bleeding in pregnancy, first trimester   3. Subchorionic hematoma in first trimester, single or unspecified fetus     Patient presented with bleeding and cramping in early pregnancy. Rule-out ectopic.  Scant brown discharge on exam with closed cervix.   Known blood type B positive. CBC, CMP WNL. UA with moderate leuks and sent for cx. HCG 26,554  US showing SIUP at [redacted]w[redacted]d. No cardiac activity yet. Small subchorionic hemorrhage.   Discussed results with patient. She has follow-up later this month with [facility].   Pt discharged with strict return precautions.  PLAN Discharge home Allergies as of 05/20/2020   No Known Allergies     Medication List    TAKE these medications   ferrous sulfate 325 (65 FE) MG tablet Take 325 mg by mouth 3 (three) times daily with meals.   prenatal multivitamin Tabs tablet Take 1 tablet daily at 12 noon by mouth.      Follow-up Information    Marlow Baars, MD Follow up.   Specialty: Obstetrics Contact information: [address] [place] [address] [phone]           Jerilynn Birkenhead, MD [facility] Fellow, [facility], [facility] 05/20/2020  6:08 PM

## 2020-05-20 NOTE — MAU Note (Signed)
Christine [name] is a 29 y.o. at [redacted]w[redacted]d here in MAU reporting: seeing some bleeding when she wipes for the past 2 days. Having intermittent lower back pain. No recent IC.  LMP: 03/20/20  Onset of complaint: ongoing  Pain score: 6/10  Vitals:   05/20/20 1559  BP: 111/72  Pulse: 87  Resp: 16  Temp: 99 F (37.2 C)  SpO2: 100%     Lab orders placed from triage: UA, UPT

## 2020-05-21 LAB — CULTURE, OB URINE

## 2020-05-26 ENCOUNTER — Inpatient Hospital Stay (HOSPITAL_COMMUNITY)
Admission: AD | Admit: 2020-05-26 | Discharge: 2020-05-26 | Disposition: A | Discharge status: Home / Self Care | Payer: Medicaid Other | Attending: Obstetrics & Gynecology | Admitting: Obstetrics & Gynecology

## 2020-05-26 ENCOUNTER — Other Ambulatory Visit: Payer: Self-pay

## 2020-05-26 ENCOUNTER — Encounter (HOSPITAL_COMMUNITY): Payer: Self-pay | Admitting: Obstetrics & Gynecology

## 2020-05-26 ENCOUNTER — Inpatient Hospital Stay (HOSPITAL_COMMUNITY): Payer: Medicaid Other

## 2020-05-26 DIAGNOSIS — O039 Complete or unspecified spontaneous abortion without complication: Secondary | ICD-10-CM | POA: Diagnosis not present

## 2020-05-26 DIAGNOSIS — Z3A08 8 weeks gestation of pregnancy: Secondary | ICD-10-CM | POA: Diagnosis not present

## 2020-05-26 DIAGNOSIS — R102 Pelvic and perineal pain: Secondary | ICD-10-CM | POA: Diagnosis present

## 2020-05-26 DIAGNOSIS — O200 Threatened abortion: Secondary | ICD-10-CM | POA: Insufficient documentation

## 2020-05-26 DIAGNOSIS — O4690 Antepartum hemorrhage, unspecified, unspecified trimester: Secondary | ICD-10-CM

## 2020-05-26 LAB — CBC
HCT: 34.7 % — ABNORMAL LOW (ref 36.0–46.0)
Hemoglobin: 11.1 g/dL — ABNORMAL LOW (ref 12.0–15.0)
MCH: 28.2 pg (ref 26.0–34.0)
MCHC: 32 g/dL (ref 30.0–36.0)
MCV: 88.1 fL (ref 80.0–100.0)
Platelets: 279 10*3/uL (ref 150–400)
RBC: 3.94 MIL/uL (ref 3.87–5.11)
RDW: 15 % (ref 11.5–15.5)
WBC: 8.5 10*3/uL (ref 4.0–10.5)
nRBC: 0 % (ref 0.0–0.2)

## 2020-05-26 NOTE — MAU Note (Signed)
Pt reports intermittent lower abdominal pain and back pain and a lot of vaginal bleeding. Was here on Sunday for bleeding but concerned as she has pain now. Has not taken anything for pain. Rates 8/10.

## 2020-05-26 NOTE — Discharge Instructions (Signed)
Miscarriage A miscarriage is the loss of an unborn baby (fetus) before the 20th week of pregnancy. Most miscarriages happen during the first 3 months of pregnancy. Sometimes, a miscarriage can happen before a woman knows that she is pregnant. Having a miscarriage can be an emotional experience. If you have had a miscarriage, talk with your health care provider about any questions you may have about miscarrying, the grieving process, and your plans for future pregnancy. What are the causes? A miscarriage may be caused by:  Problems with the genes or chromosomes of the fetus. These problems make it impossible for the baby to develop normally. They are often the result of random errors that occur early in the development of the baby, and are not passed from parent to child (not inherited).  Infection of the cervix or uterus.  Conditions that affect hormone balance in the body.  Problems with the cervix, such as the cervix opening and thinning before pregnancy is at term (cervical insufficiency).  Problems with the uterus. These may include: ? A uterus with an abnormal shape. ? Fibroids in the uterus. ? Congenital abnormalities. These are problems that were present at birth.  Certain medical conditions.  Smoking, drinking alcohol, or using drugs.  Injury (trauma). In many cases, the cause of a miscarriage is not known. What are the signs or symptoms? Symptoms of this condition include:  Vaginal bleeding or spotting, with or without cramps or pain.  Pain or cramping in the abdomen or lower back.  Passing fluid, tissue, or blood clots from the vagina. How is this diagnosed? This condition may be diagnosed based on:  A physical exam.  Ultrasound.  Blood tests.  Urine tests. How is this treated? Treatment for a miscarriage is sometimes not necessary if you naturally pass all the tissue that was in your uterus. If necessary, this condition may be treated with:  Dilation and  curettage (D&C). This is a procedure in which the cervix is stretched open and the lining of the uterus (endometrium) is scraped. This is done only if tissue from the fetus or placenta remains in the body (incomplete miscarriage).  Medicines, such as: ? Antibiotic medicine, to treat infection. ? Medicine to help the body pass any remaining tissue. ? Medicine to reduce (contract) the size of the uterus. These medicines may be given if you have a lot of bleeding. If you have Rh negative blood and your baby was Rh positive, you will need a shot of a medicine called Rh immunoglobulinto protect your future babies from Rh blood problems. "Rh-negative" and "Rh-positive" refer to whether or not the blood has a specific protein found on the surface of red blood cells (Rh factor). Follow these instructions at home: Medicines   Take over-the-counter and prescription medicines only as told by your health care provider.  If you were prescribed antibiotic medicine, take it as told by your health care provider. Do not stop taking the antibiotic even if you start to feel better.  Do not take NSAIDs, such as aspirin and ibuprofen, unless they are approved by your health care provider. These medicines can cause bleeding. Activity  Rest as directed. Ask your health care provider what activities are safe for you.  Have someone help with home and family responsibilities during this time. General instructions  Keep track of the number of sanitary pads you use each day and how soaked (saturated) they are. Write down this information.  Monitor the amount of tissue or blood clots that   you pass from your vagina. Save any large amounts of tissue for your health care provider to examine.  Do not use tampons, douche, or have sex until your health care provider approves.  To help you and your partner with the process of grieving, talk with your health care provider or seek counseling.  When you are ready, meet with  your health care provider to discuss any important steps you should take for your health. Also, discuss steps you should take to have a healthy pregnancy in the future.  Keep all follow-up visits as told by your health care provider. This is important. Where to find more information  The American Congress of Obstetricians and Gynecologists: www.acog.org  [place].S. Department of Health and Human Services Office of Women's Health: www.womenshealth.gov Contact a health care provider if:  You have a fever or chills.  You have a foul smelling vaginal discharge.  You have more bleeding instead of less. Get help right away if:  You have severe cramps or pain in your back or abdomen.  You pass blood clots or tissue from your vagina that is walnut-sized or larger.  You soak more than 1 regular sanitary pad in an hour.  You become light-headed or weak.  You pass out.  You have feelings of sadness that take over your thoughts, or you have thoughts of hurting yourself. Summary  Most miscarriages happen in the first 3 months of pregnancy. Sometimes miscarriage happens before a woman even knows that she is pregnant.  Follow your health care provider's instruction for home care. Keep all follow-up appointments.  To help you and your partner with the process of grieving, talk with your health care provider or seek counseling. This information is not intended to replace advice given to you by your health care provider. Make sure you discuss any questions you have with your health care provider. Document Revised: 03/26/2019 Document Reviewed: 01/07/2017 Elsevier Patient Education  2020 Elsevier Inc.   Managing Pregnancy Loss Pregnancy loss can happen any time during a pregnancy. Often the cause is not known. It is rarely because of anything you did. Pregnancy loss in early pregnancy (during the first trimester) is called a miscarriage. This type of pregnancy loss is the most common. Pregnancy loss  that happens after 20 weeks of pregnancy is called fetal demise if the baby's heart stops beating before birth. Fetal demise is much less common. Some women experience spontaneous labor shortly after fetal demise resulting in a stillborn birth (stillbirth). Any pregnancy loss can be devastating. You will need to recover both physically and emotionally. Most women are able to get pregnant again after a pregnancy loss and deliver a healthy baby. How to manage emotional recovery  Pregnancy loss is very hard emotionally. You may feel many different emotions while you grieve. You may feel sad and angry. You may also feel guilty. It is normal to have periods of crying. Emotional recovery can take longer than physical recovery. It is different for everyone. Taking these steps can help you in managing this loss:  Remember that it is unlikely you did anything to cause the pregnancy loss.  Share your thoughts and feelings with friends, family, and your partner. Remember that your partner is also recovering emotionally.  Make sure you have a good support system. Do not spend too much time alone.  Meet with a pregnancy loss counselor or join a pregnancy loss support group.  Get enough sleep and eat a healthy diet. Return to regular exercise   when you have recovered physically.  Do not use drugs or alcohol to manage your emotions.  Consider seeing a mental health professional to help you recover emotionally.  Ask a friend or loved one to help you decide what to do with any clothing and nursery items you received for your baby. In the case of a stillbirth, many women benefit from taking additional steps in the grieving process. You may want to:  Hold your baby after the birth.  Name your baby.  Request a birth certificate.  Create a keepsake such as handprints or footprints.  Dress your baby and have a picture taken.  Make funeral arrangements.  Ask for a baptism or blessing. Hospitals have  staff members who can help you with all these arrangements. How to recognize emotional stress It is normal to have emotional stress after a pregnancy loss. But emotional stress that lasts a long time or becomes severe requires treatment. Watch out for these signs of severe emotional stress:  Sadness, anger, or guilt that is not going away and is interfering with your normal activities.  Relationship problems that have occurred or gotten worse since the pregnancy loss.  Signs of depression that last longer than 2 weeks. These may include: ? Sadness. ? Anxiety. ? Hopelessness. ? Loss of interest in activities you enjoy. ? Inability to concentrate. ? Trouble sleeping or sleeping too much. ? Loss of appetite or overeating. ? Thoughts of death or of hurting yourself. Follow these instructions at home:  Take over-the-counter and prescription medicines only as told by your health care provider.  Rest at home until your energy level returns. Return to your normal activities as told by your health care provider. Ask your health care provider what activities are safe for you.  When you are ready, meet with your health care provider to discuss steps to take for a future pregnancy.  Keep all follow-up visits as told by your health care provider. This is important. Where to find support  To help you and your partner with the process of grieving, talk with your health care provider or seek counseling.  Consider meeting with others who have experienced pregnancy loss. Ask your health care provider about support groups and resources. Where to find more information  U.S. Department of Health and Human Services Office on Women's Health: www.womenshealth.gov  American Pregnancy Association: www.americanpregnancy.org Contact a health care provider if:  You continue to experience grief, sadness, or lack of motivation for everyday activities, and those feelings do not improve over time.  You are  struggling to recover emotionally, especially if you are using alcohol or substances to help. Get help right away if:  You have thoughts of hurting yourself or others. If you ever feel like you may hurt yourself or others, or have thoughts about taking your own life, get help right away. You can go to your nearest emergency department or call:  Your local emergency services (911 in the [place]  A suicide crisis helpline, such as the National Suicide Prevention Lifeline at 1-800-273-8255. This is open 24 hours a day. Summary  Any pregnancy loss can be difficult physically and emotionally.  You may experience many different emotions while you grieve. Emotional recovery can last longer than physical recovery.  It is normal to have emotional stress after a pregnancy loss. But emotional stress that lasts a long time or becomes severe requires treatment.  See your health care provider if you are struggling emotionally after a pregnancy loss. This information   is not intended to replace advice given to you by your health care provider. Make sure you discuss any questions you have with your health care provider. Document Revised: 03/24/2019 Document Reviewed: 02/12/2018 Elsevier Patient Education  2020 Elsevier Inc.  

## 2020-05-26 NOTE — MAU Provider Note (Signed)
History     CSN: [id number]  Arrival date and time: 05/26/20 9983   First Provider Initiated Contact with Patient 05/26/20 2215      Chief Complaint  Patient presents with  . Pelvic Pain  . Vaginal Bleeding   Christine [name] is a 29 y.o. G2P1001 at [redacted]w[redacted]d who receives care at [facility].  She presents today for Pelvic Pain and Vaginal Bleeding.  She states she has been having vaginal bleeding since her last visit on June 5th.  However, today she was experiencing severe pain at home and called her husband.  Husband states that he received a call around 630pm with patient c/o bleeding and pain.  Patient states she has passed some tissue and continues to bleed, but the pain has improved. Patient states that "I am sure I had an abortion."      OB History    Gravida  2   Para  1   Term  1   Preterm  0   AB  0   Living  1     SAB  0   TAB  0   Ectopic  0   Multiple      Live Births  1           Past Medical History:  Diagnosis Date  . Anemia   . Medical history non-contributory     Past Surgical History:  Procedure Laterality Date  . NO PAST SURGERIES      History reviewed. No pertinent family history.  Social History   Tobacco Use  . Smoking status: Never Smoker  . Smokeless tobacco: Never Used  Substance Use Topics  . Alcohol use: Not Currently  . Drug use: Not Currently    Allergies: No Known Allergies  Medications Prior to Admission  Medication Sig Dispense Refill Last Dose  . Prenatal Vit-Fe Fumarate-FA (PRENATAL MULTIVITAMIN) TABS tablet Take 1 tablet daily at 12 noon by mouth.   05/26/2020 at Unknown time  . ferrous sulfate 325 (65 FE) MG tablet Take 325 mg by mouth 3 (three) times daily with meals.        Review of Systems  Genitourinary: Positive for pelvic pain (Cramping) and vaginal bleeding.   Physical Exam   Blood pressure 120/66, pulse 87, temperature 98.5 F (36.9 C), temperature source Oral, resp. rate 16, last menstrual  period 03/25/2020, SpO2 97 %, unknown if currently breastfeeding.  Physical Exam  Nursing note and vitals reviewed. Eyes: Conjunctivae are normal.  Cardiovascular: Normal rate.  Respiratory: Effort normal. No respiratory distress.  GI: Normal appearance.  Musculoskeletal:        General: Normal range of motion.  Neurological: She is alert.  Psychiatric: Mood and thought content normal.    MAU Course  Procedures Results for orders placed or performed during the hospital encounter of 05/26/20 (from the past 24 hour(s))  CBC     Status: Abnormal   Collection Time: 05/26/20  8:02 PM  Result Value Ref Range   WBC 8.5 4.0 - 10.5 K/uL   RBC 3.94 3.87 - 5.11 MIL/uL   Hemoglobin 11.1 (L) 12.0 - 15.0 g/dL   HCT 34.7 (L) 36 - 46 %   MCV 88.1 80.0 - 100.0 fL   MCH 28.2 26.0 - 34.0 pg   MCHC 32.0 30.0 - 36.0 g/dL   RDW 15.0 11.5 - 15.5 %   Platelets 279 150 - 400 K/uL   nRBC 0.0 0.0 - 0.2 %   US OB Comp  Less 14 Wks  Result Date: 05/26/2020 CLINICAL DATA:  Bleeding EXAM: OBSTETRIC <14 WK ULTRASOUND TECHNIQUE: Transabdominal ultrasound was performed for evaluation of the gestation as well as the maternal uterus and adnexal regions. COMPARISON:  05/20/2020 FINDINGS: Intrauterine gestational sac: None Maternal uterus/adnexae: The endometrium is thickened without evidence for an IUP. There is a simple appearing cyst involving the right ovary measuring 2.8 cm. IMPRESSION: The previously demonstrated IUP is no longer visualized on this study. There is endometrial thickening without increased vascularity. Findings are nonspecific and could indicate endometrial blood products although hypovascular retained products of conception cannot be fully excluded. Consider short-term follow-up pelvic ultrasound. Electronically Signed   By: Katherine Mantle M.D.   On: 05/26/2020 22:49    MDM Labs: CBC, hCG Ultrasound  Assessment and Plan  29 year old, G2P1001  Vaginal Bleeding Threatened  Miscarriage  -Reviewed POC with patient. -Informed that pelvic exam would be deferred as patient had all testing last week. -Patient agreeable. -Will send for [place] and await results.   Cherre Robins 05/26/2020, 10:16 PM   Reassessment (11:11 PM) No IUP Identified  -[place] results show no IUP or GS as noted in previous US. -Results discussed with patient and husband. -Condolences and support given particularly to husband who was hopeful that pregnancy was progressing.   -Informed that miscarriage is a common occurrence, but that causes are yet to be fully understood. -Husband questions what they can do in the future to avoid this process and instructed to discuss with primary ob. -Discussed need for follow up in 2 weeks or as deemed appropriate by primary ob for repeat blood work. -Patient reports appt on Thursday. -Office called and message left. -hCG added on to previous collection. -Patient questions when bleeding will end and informed that it varies, but bleeding precautions given. -Encouraged to call or return to MAU if symptoms worsen or with the onset of new symptoms. -Discharged to home in stable condition.   Cherre Robins MSN, CNM Advanced Practice Provider, [facility] [employer]

## 2020-05-27 LAB — HCG, QUANTITATIVE, PREGNANCY: hCG, Beta Chain, Quant, S: 15509 m[IU]/mL — ABNORMAL HIGH (ref <5)

## 2020-11-23 IMAGING — US US OB < 14 WEEKS - US OB TV
1 series · 15 of 28 positions shown · non-contrast
Comparison: None.

CLINICAL DATA: Vaginal bleeding

EXAM:
OBSTETRIC <14 WK US AND TRANSVAGINAL OB US
TECHNIQUE: Both transabdominal and transvaginal ultrasound examinations were
performed for complete evaluation of the gestation as well as the
maternal uterus, adnexal regions, and pelvic cul-de-sac.
Transvaginal technique was performed to assess early pregnancy.

[Series 1: us ob < 14 weeks - us ob tv · 56 acquisitions, 15 frames shown]
[im 1/56]
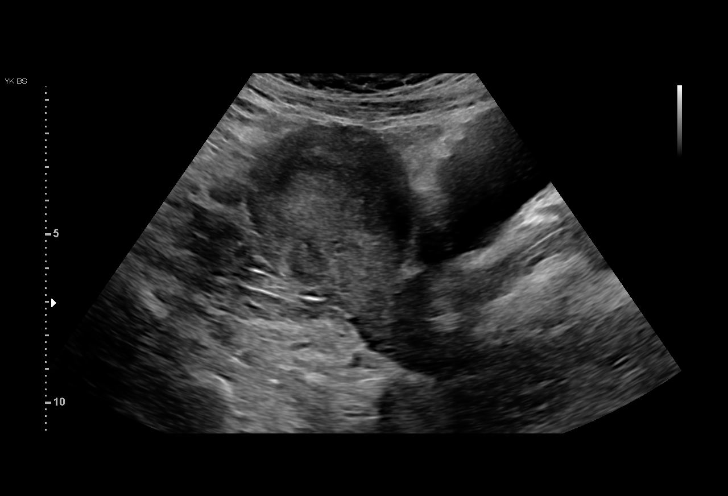
[im 5/56]
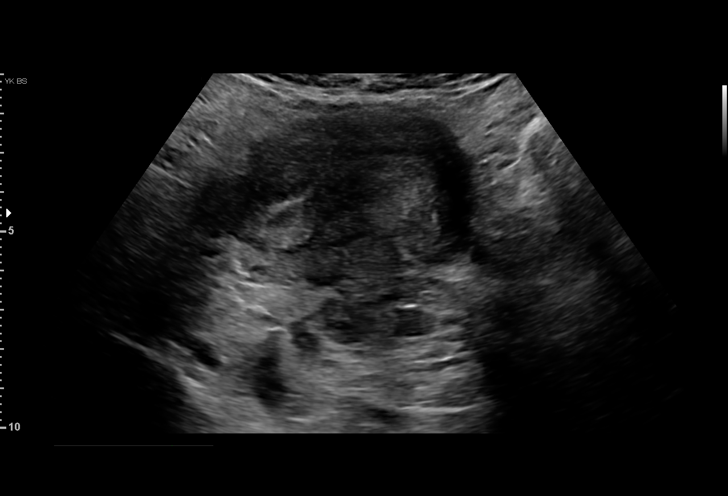
[im 9/56]
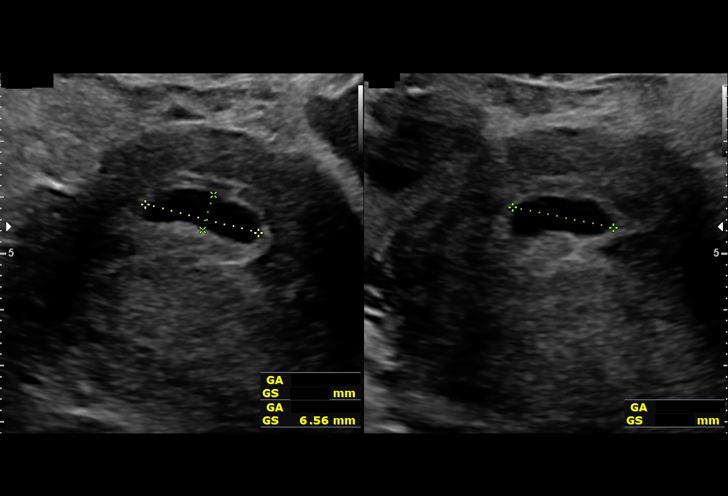
[im 13/56]
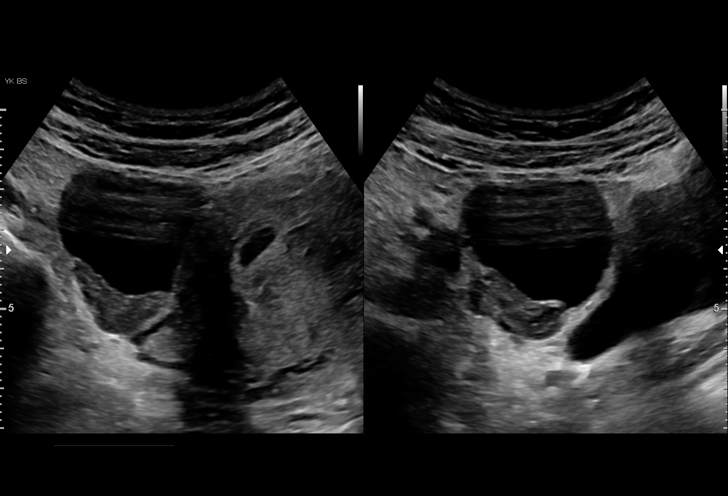
[im 17/56]
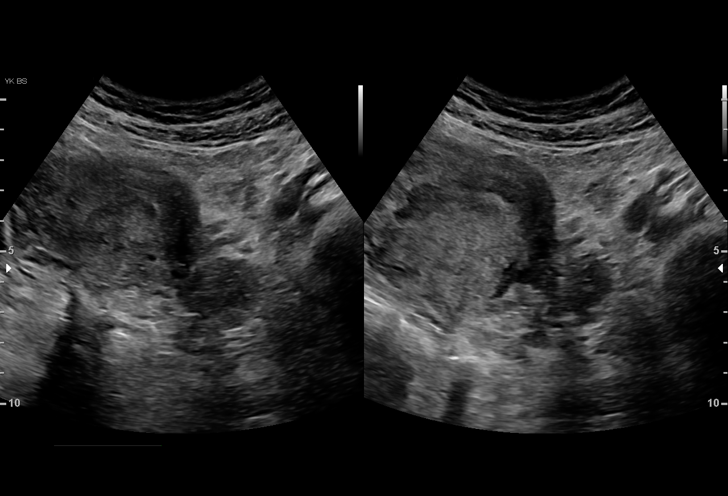
[im 21/56]
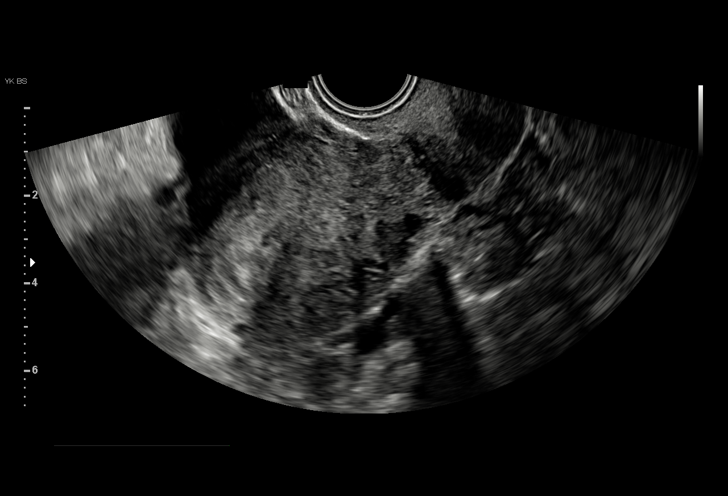
[im 25/56]
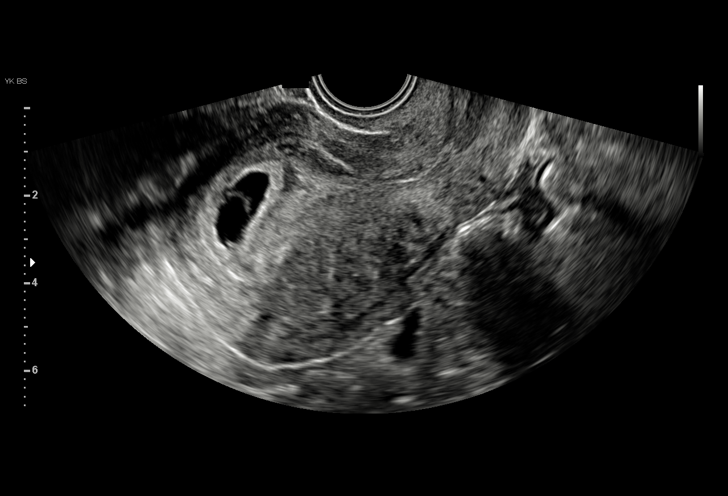
[im 29/56]
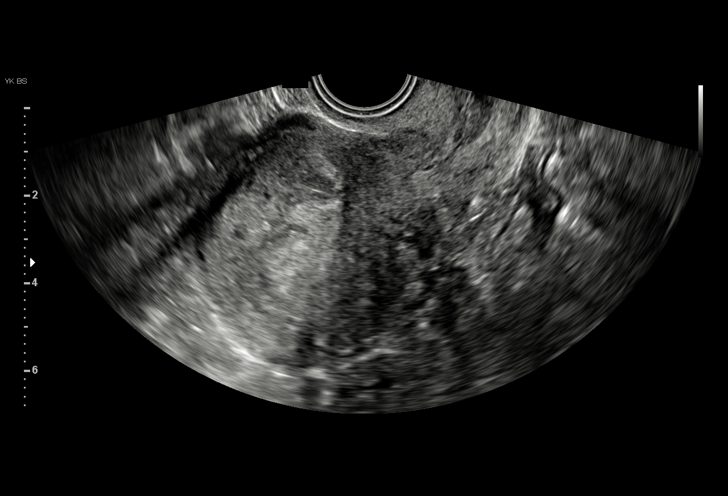
[im 31/56]
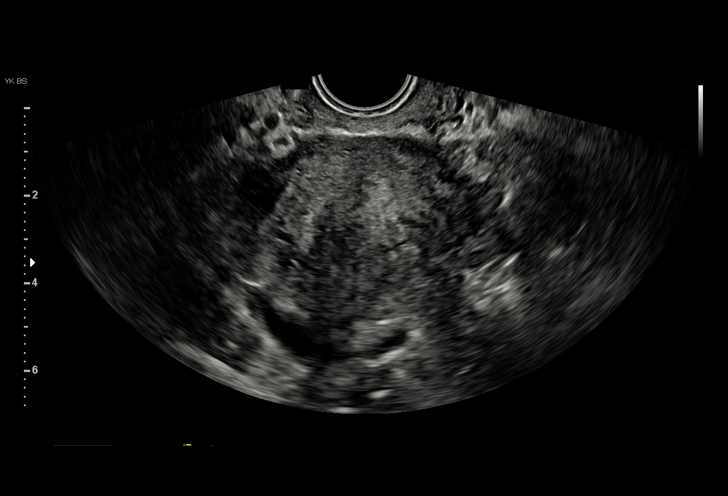
[im 35/56]
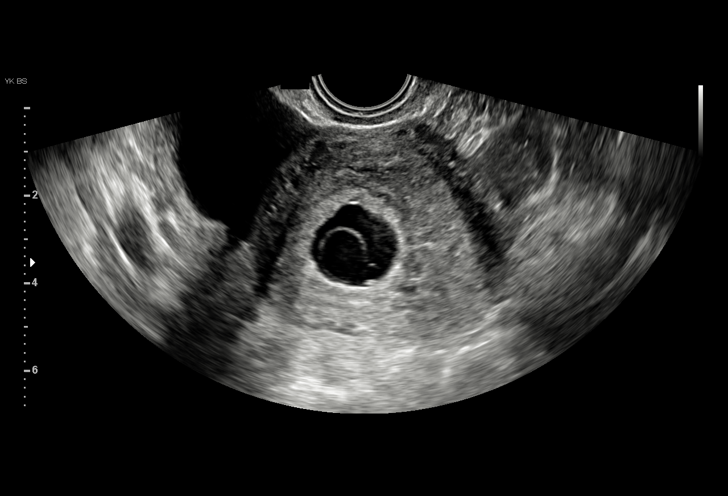
[im 39/56]
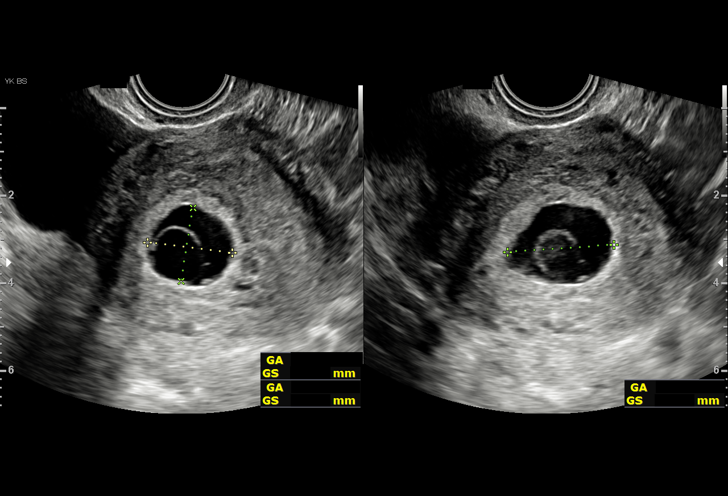
[im 43/56]
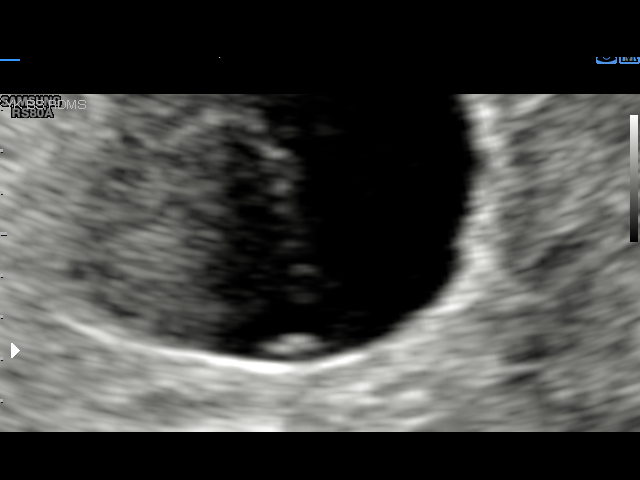
[im 47/56]
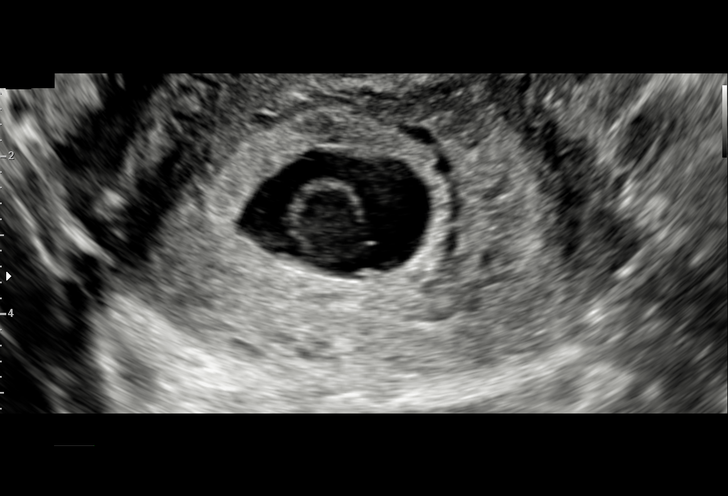
[im 51/56]
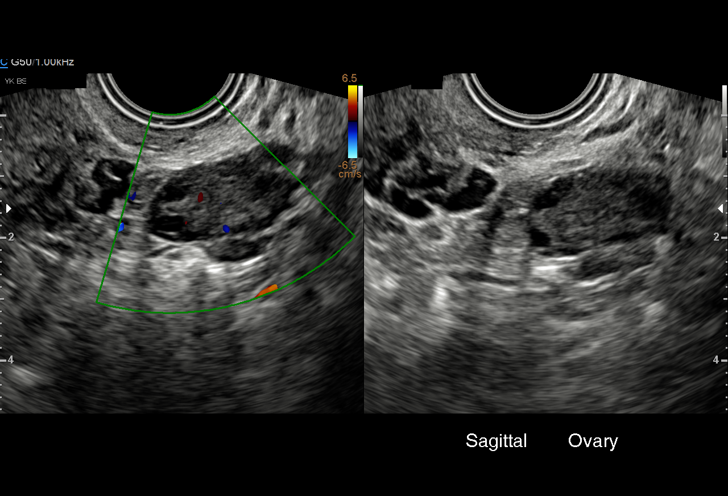
[im 56/56]
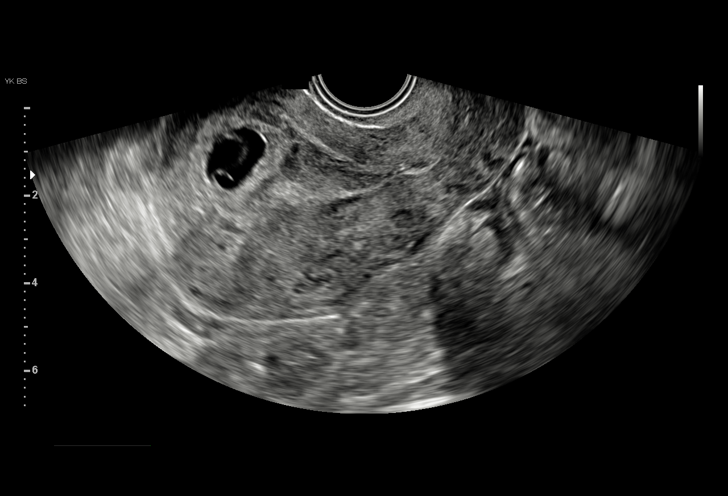

[15 of 28 positions shown; findings below may reference images not displayed]

FINDINGS: Intrauterine gestational sac: Single

Yolk sac:  Visualized.

Embryo:  Visualized.

Cardiac Activity: Not Visualized.

CRL:  2.9 mm   5 w   5 d                  US EDC: 01/15/2021

Subchorionic hemorrhage:  Small subchorionic hemorrhage visualized.

Maternal uterus/adnexae: Left ovary measures 2.4 x 1.3 by 2.1 cm and
the right ovary measures 4.1 x 3.8 x 4.0 cm. There is a simple
appearing 3.7 x 3.3 x 3.7 cm cyst identified within the right ovary.
No free fluid.
IMPRESSION: 1. Single intrauterine pregnancy as above, estimated age 5 weeks and
5 days based on crown-rump length measurement. Cardiac activity is
not yet documented, likely due to early gestational age. Short
interval follow-up ultrasound recommended to document viable
pregnancy.
2. Small subchorionic hemorrhage.
3. Simple appearing right ovarian cyst.

## 2020-11-29 IMAGING — US US OB COMP LESS 14 WK
1 series · 15 of 28 positions shown · non-contrast
Comparison: 05/20/2020

CLINICAL DATA: Bleeding

EXAM:
OBSTETRIC <14 WK ULTRASOUND
TECHNIQUE: Transabdominal ultrasound was performed for evaluation of the
gestation as well as the maternal uterus and adnexal regions.

[Series 1: us ob comp less 14 wk · 43 acquisitions, 15 frames shown]
[im 1/43]
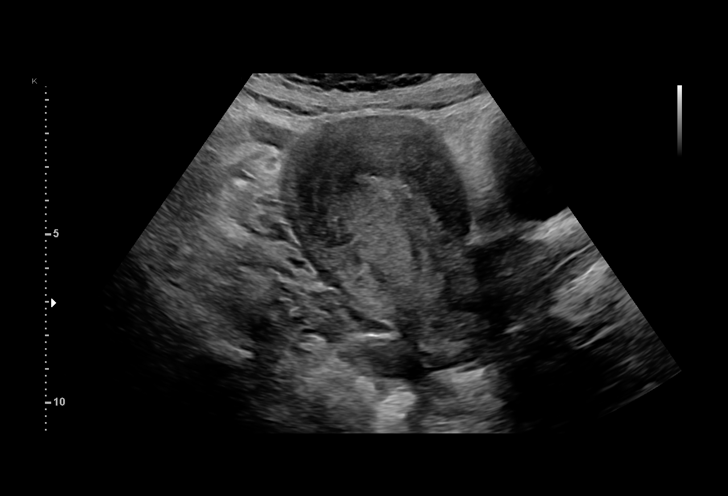
[im 4/43]
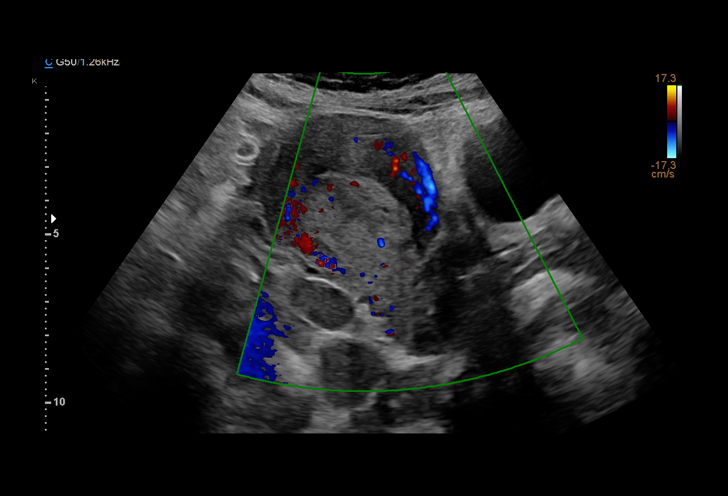
[im 7/43]
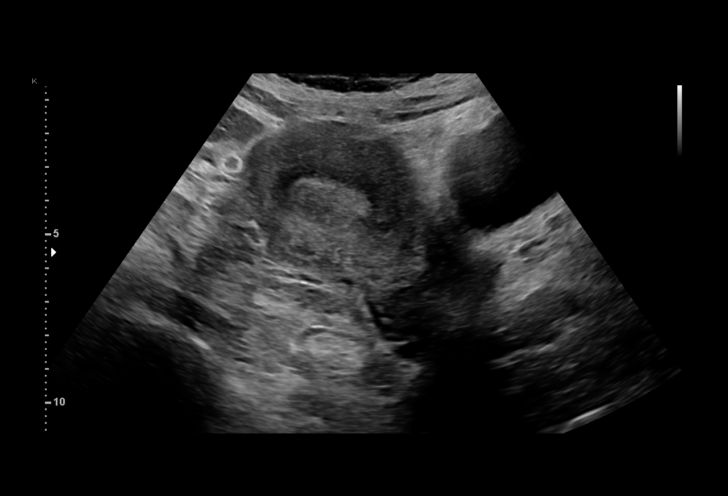
[im 10/43]
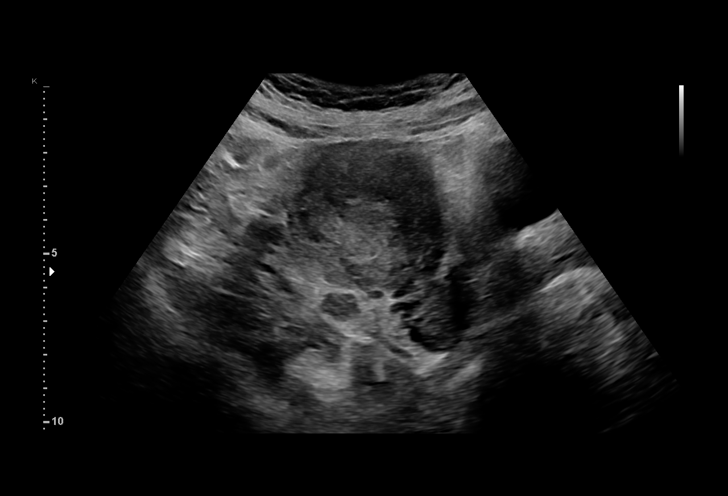
[im 13/43]
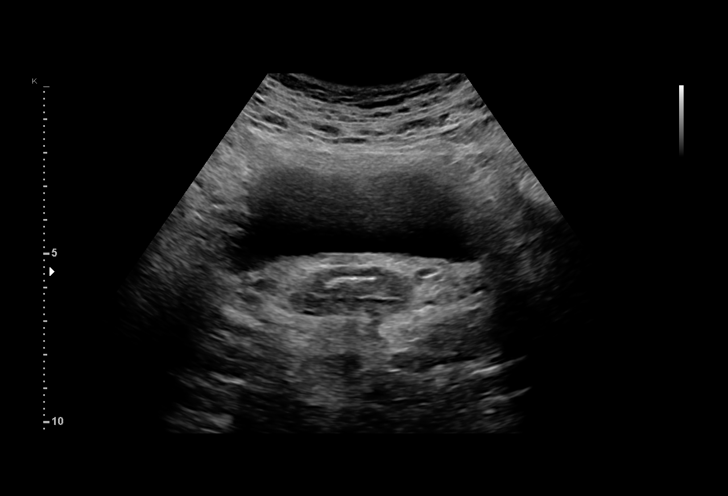
[im 16/43]
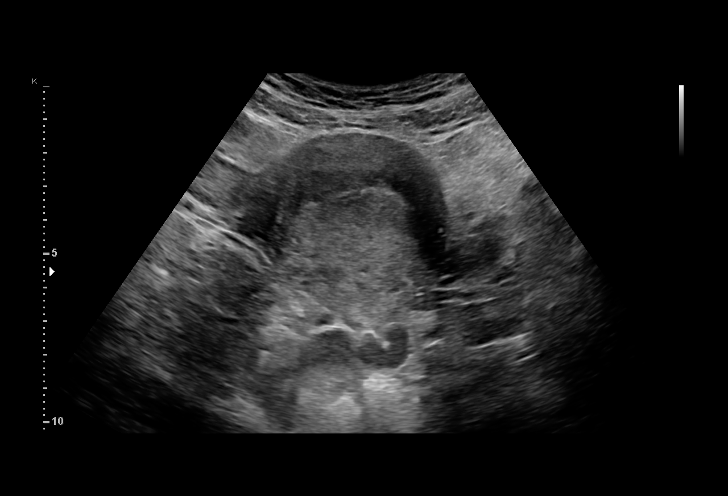
[im 19/43]
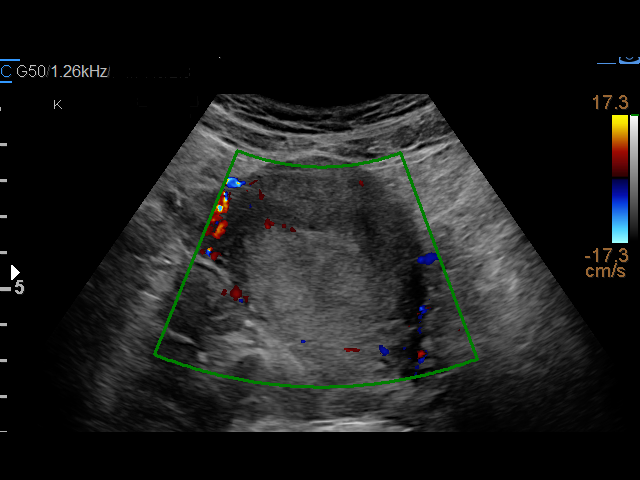
[im 22/43]
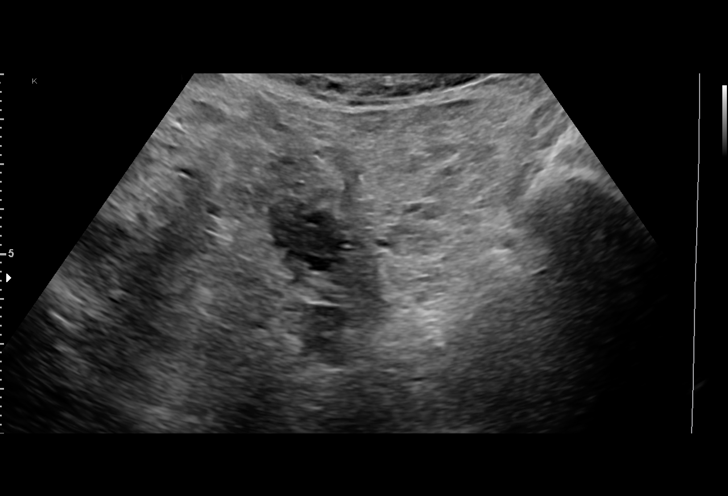
[im 24/43]
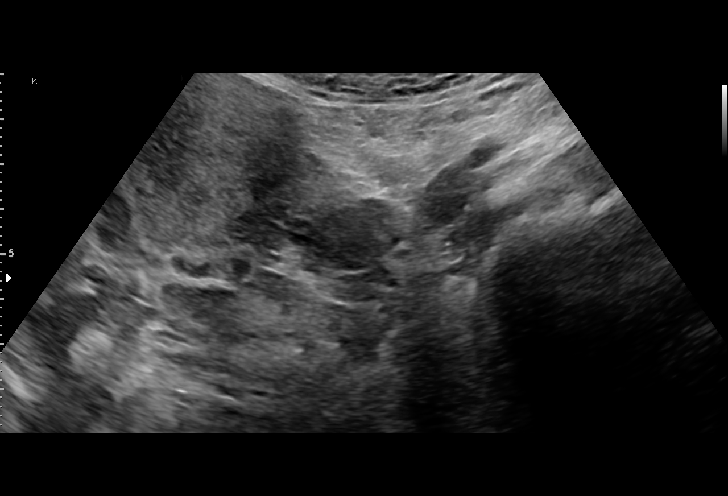
[im 27/43]
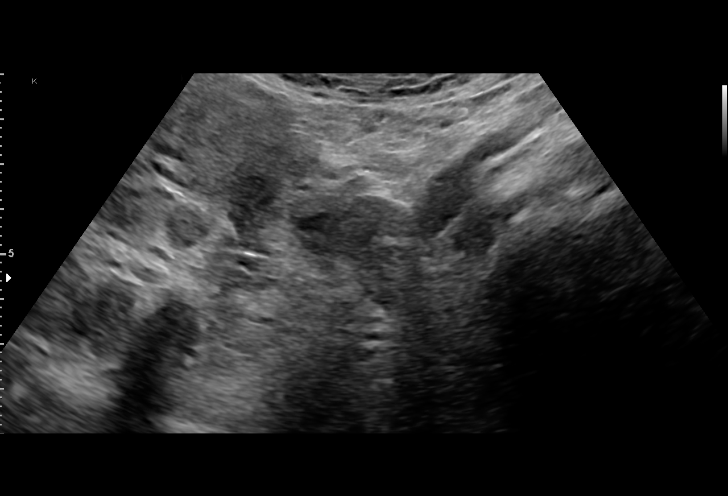
[im 30/43]
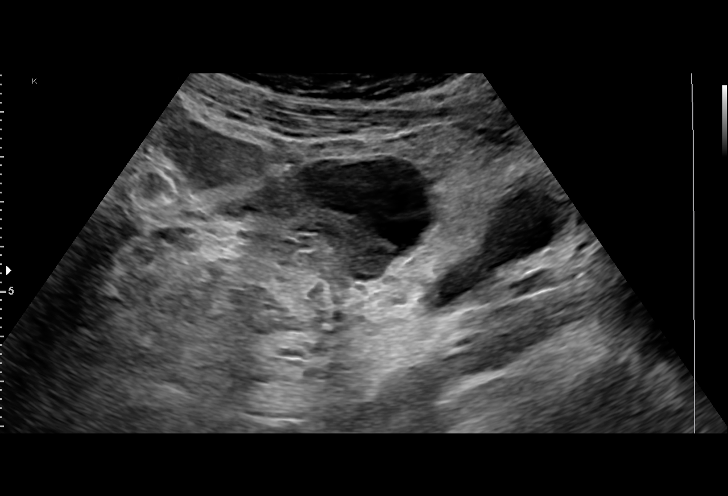
[im 33/43]
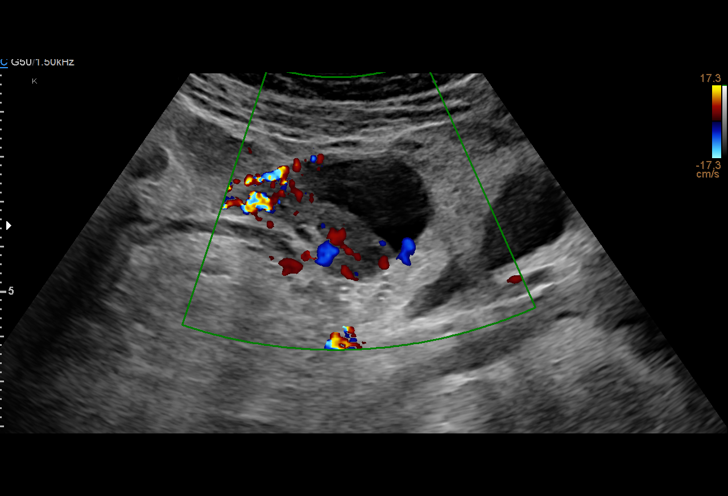
[im 36/43]
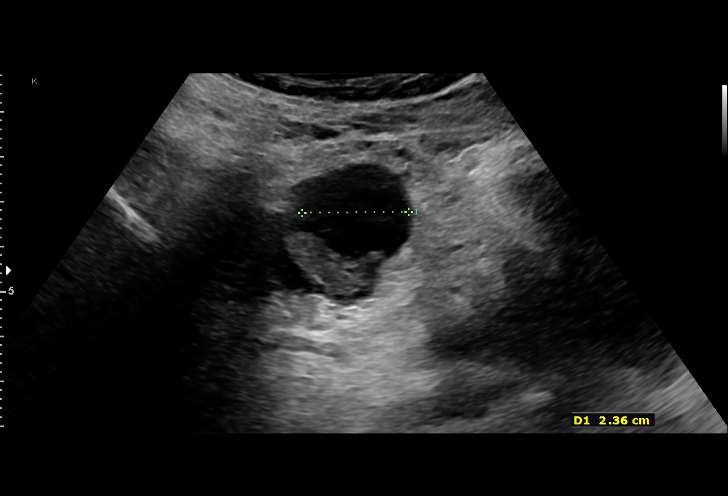
[im 39/43]
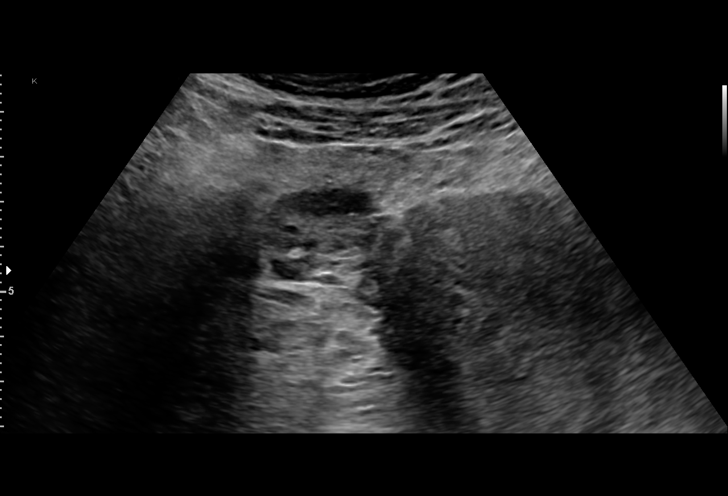
[im 43/43]
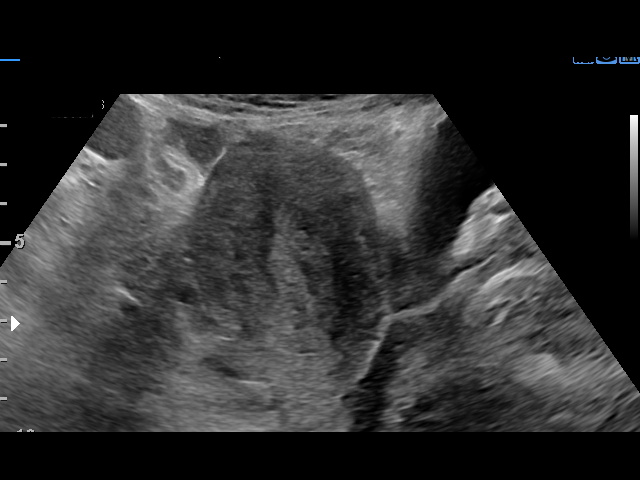

[15 of 28 positions shown; findings below may reference images not displayed]

FINDINGS: Intrauterine gestational sac: None

Maternal uterus/adnexae: The endometrium is thickened without
evidence for an IUP. There is a simple appearing cyst involving the
right ovary measuring 2.8 cm.
IMPRESSION: The previously demonstrated IUP is no longer visualized on this
study.

There is endometrial thickening without increased vascularity.
Findings are nonspecific and could indicate endometrial blood
products although hypovascular retained products of conception
cannot be fully excluded. Consider short-term follow-up pelvic
ultrasound.

## 2020-12-16 NOTE — L&D Delivery Note (Signed)
DELIVERY NOTE  Pt complete and at +2 station with urge to push. Epidural controlling pain. Pt pushed and delivered a viable female infant in LOA position. Anterior and posterior shoulders spontaneously delivered with next two pushes; body easily followed next. Infant placed on mothers abdomen and bulb suction of mouth and nose performed. Cord was then clamped and cut by MD. Cord blood obtained, 3VC. Baby had a vigorous spontaneous cry noted. Placenta then delivered at 1439 intact. Fundal massage performed and pitocin per protocol. Fundus firm. The following lacerations were noted: 2nd deg. Repaired in routine fashion with 2-0 vicryl, EBL 400cc. Mother and baby stable. Counts correct   Infant time: 43 Gender: female Placenta time: 1439 Apgars: 9/9 Weight: pending skin-to-skin

## 2021-02-22 LAB — OB RESULTS CONSOLE GC/CHLAMYDIA
Chlamydia: NEGATIVE
Gonorrhea: NEGATIVE

## 2021-03-15 LAB — OB RESULTS CONSOLE HEPATITIS B SURFACE ANTIGEN: Hepatitis B Surface Ag: NEGATIVE

## 2021-03-15 LAB — OB RESULTS CONSOLE RPR: RPR: NONREACTIVE

## 2021-03-15 LAB — OB RESULTS CONSOLE ABO/RH: RH Type: POSITIVE

## 2021-03-15 LAB — OB RESULTS CONSOLE HIV ANTIBODY (ROUTINE TESTING): HIV: NONREACTIVE

## 2021-03-15 LAB — OB RESULTS CONSOLE ANTIBODY SCREEN: Antibody Screen: NEGATIVE

## 2021-03-15 LAB — OB RESULTS CONSOLE RUBELLA ANTIBODY, IGM: Rubella: IMMUNE

## 2021-06-06 ENCOUNTER — Other Ambulatory Visit: Payer: Self-pay

## 2021-06-06 ENCOUNTER — Encounter (HOSPITAL_COMMUNITY): Payer: Self-pay | Admitting: Obstetrics and Gynecology

## 2021-06-06 ENCOUNTER — Inpatient Hospital Stay (HOSPITAL_COMMUNITY)
Admission: AD | Admit: 2021-06-06 | Discharge: 2021-06-06 | Disposition: A | Discharge status: Home / Self Care | Payer: Medicaid Other | Attending: Obstetrics and Gynecology | Admitting: Obstetrics and Gynecology

## 2021-06-06 ENCOUNTER — Telehealth: Payer: Self-pay | Admitting: Student

## 2021-06-06 DIAGNOSIS — O99012 Anemia complicating pregnancy, second trimester: Secondary | ICD-10-CM

## 2021-06-06 DIAGNOSIS — D649 Anemia, unspecified: Secondary | ICD-10-CM | POA: Diagnosis not present

## 2021-06-06 DIAGNOSIS — D509 Iron deficiency anemia, unspecified: Secondary | ICD-10-CM

## 2021-06-06 DIAGNOSIS — Z3A23 23 weeks gestation of pregnancy: Secondary | ICD-10-CM

## 2021-06-06 DIAGNOSIS — O99352 Diseases of the nervous system complicating pregnancy, second trimester: Secondary | ICD-10-CM

## 2021-06-06 DIAGNOSIS — O99342 Other mental disorders complicating pregnancy, second trimester: Secondary | ICD-10-CM | POA: Insufficient documentation

## 2021-06-06 DIAGNOSIS — F419 Anxiety disorder, unspecified: Secondary | ICD-10-CM

## 2021-06-06 DIAGNOSIS — O99891 Other specified diseases and conditions complicating pregnancy: Secondary | ICD-10-CM | POA: Diagnosis not present

## 2021-06-06 DIAGNOSIS — R000 Tachycardia, unspecified: Secondary | ICD-10-CM

## 2021-06-06 LAB — CBC
HCT: 31.4 % — ABNORMAL LOW (ref 36.0–46.0)
Hemoglobin: 10.1 g/dL — ABNORMAL LOW (ref 12.0–15.0)
MCH: 30.7 pg (ref 26.0–34.0)
MCHC: 32.2 g/dL (ref 30.0–36.0)
MCV: 95.4 fL (ref 80.0–100.0)
Platelets: 200 10*3/uL (ref 150–400)
RBC: 3.29 MIL/uL — ABNORMAL LOW (ref 3.87–5.11)
RDW: 13.7 % (ref 11.5–15.5)
WBC: 10.9 10*3/uL — ABNORMAL HIGH (ref 4.0–10.5)
nRBC: 0 % (ref 0.0–0.2)

## 2021-06-06 LAB — URINALYSIS, ROUTINE W REFLEX MICROSCOPIC
Bilirubin Urine: NEGATIVE
Glucose, UA: NEGATIVE mg/dL
Hgb urine dipstick: NEGATIVE
Ketones, ur: NEGATIVE mg/dL
Nitrite: NEGATIVE
Protein, ur: NEGATIVE mg/dL
Specific Gravity, Urine: 1.02 (ref 1.005–1.030)
pH: 7 (ref 5.0–8.0)

## 2021-06-06 MED ORDER — HYDROXYZINE HCL 25 MG PO TABS
25.0000 mg | ORAL_TABLET | Freq: Three times a day (TID) | ORAL | Status: DC | PRN
  Administered 2021-06-06: 25 mg via ORAL
  Filled 2021-06-06 (×2): qty 1

## 2021-06-06 MED ORDER — HYDROXYZINE HCL 25 MG PO TABS: 25.0000 mg | ORAL_TABLET | Freq: Four times a day (QID) | ORAL | 0 refills | Status: DC | PRN

## 2021-06-06 NOTE — MAU Note (Signed)
..  Christine [name] is a 30 y.o. at [redacted]w[redacted]d here in MAU reporting: Her heart feels like it is racing, as if she is anxious or the feeling you get when you are scared of something, it began yesterday and has gotten worse throughout the day. This has never happened to her before.  Denies pain or vaginal bleeding. +FM Pain score: 0/10 Vitals:   06/06/21 1915  BP: 115/67  Pulse: (!) 102  Resp: 16  Temp: 98.1 F (36.7 C)  SpO2: 99%     FHT: 149

## 2021-06-06 NOTE — MAU Provider Note (Addendum)
Patient Christine [name] is a 30 y.o. G3P1001  At [redacted]w[redacted]d here with complaints of anxiety and heart racing. She attributes this to her active son and the stress of taking care of him. She denies vaginal bleeding, vaginal discharge, LOF, decreased fetal movements. She denies any history of blood pressure, blood sugar issues, cardiac issues. She has never been diagnosed with any heart conditions in the past.   She is a patient of [facility].  History     CSN: [id number]  Arrival date and time: 06/06/21 1811   None     No chief complaint on file.  HPI Patient reports that she feels that her heart is racing. She denies fever, SOB, dizziness, syncope. She reports that she is anemic. She denies any other complaints. She denies any recent stressors other than taking care of active son. She reports no changes in family dynamics, job or life stressors. She ate a normal diet, no extra caffeine, no smoking. No chocolate intake. She denies any nausea, vomiting. She reports that she is craving some salty chips.  OB History     Gravida  3   Para  1   Term  1   Preterm  0   AB  0   Living  1      SAB  0   IAB  0   Ectopic  0   Multiple      Live Births  1           Past Medical History:  Diagnosis Date   Anemia    Medical history non-contributory     Past Surgical History:  Procedure Laterality Date   NO PAST SURGERIES      No family history on file.  Social History   Tobacco Use   Smoking status: Never   Smokeless tobacco: Never  Substance Use Topics   Alcohol use: Not Currently   Drug use: Not Currently    Allergies: No Known Allergies  Medications Prior to Admission  Medication Sig Dispense Refill Last Dose   ferrous sulfate 325 (65 FE) MG tablet Take 325 mg by mouth 3 (three) times daily with meals.       Prenatal Vit-Fe Fumarate-FA (PRENATAL MULTIVITAMIN) TABS tablet Take 1 tablet daily at 12 noon by mouth.       Review of Systems  Constitutional:  Negative.   HENT: Negative.    Respiratory: Negative.    Cardiovascular:  Positive for palpitations.  Gastrointestinal: Negative.   Genitourinary: Negative.   Musculoskeletal: Negative.   Neurological: Negative.   Hematological: Negative.   Psychiatric/Behavioral: Negative.    Physical Exam   Blood pressure 115/67, pulse (!) 102, temperature 98.1 F (36.7 C), temperature source Oral, resp. rate 16, height 5' 1.42" (1.56 m), weight 62.8 kg, SpO2 99 %, unknown if currently breastfeeding.  Physical Exam Constitutional:      Appearance: Normal appearance.  HENT:     Head: Normocephalic.  Cardiovascular:     Rate and Rhythm: Normal rate.     Pulses: Normal pulses.  Pulmonary:     Effort: Pulmonary effort is normal.  Neurological:     General: No focal deficit present.     Mental Status: She is alert and oriented to person, place, and time.     Motor: No weakness.    MAU Course  Procedures  MDM -NST: 145 bpm, mod var, present acel, no decels, no contractions -Normal EKG -will check CBC as patient reports that she is  anemic -UA is normal -will offer vistaril for anxiety Patient care endorsed to [facility] at 2028  Assumed care EKG showed Normal sinus rhythm Results for orders placed or performed during the hospital encounter of 06/06/21 (from the past 24 hour(s))  Urinalysis, Routine w reflex microscopic Urine, Clean Catch     Status: Abnormal   Collection Time: 06/06/21  7:25 PM  Result Value Ref Range   Color, Urine YELLOW YELLOW   APPearance HAZY (A) CLEAR   Specific Gravity, Urine 1.020 1.005 - 1.030   pH 7.0 5.0 - 8.0   Glucose, UA NEGATIVE NEGATIVE mg/dL   Hgb urine dipstick NEGATIVE NEGATIVE   Bilirubin Urine NEGATIVE NEGATIVE   Ketones, ur NEGATIVE NEGATIVE mg/dL   Protein, ur NEGATIVE NEGATIVE mg/dL   Nitrite NEGATIVE NEGATIVE   Leukocytes,Ua MODERATE (A) NEGATIVE   RBC / HPF 0-5 0 - 5 RBC/hpf   WBC, UA 21-50 0 - 5 WBC/hpf   Bacteria, UA RARE (A) NONE SEEN    Squamous Epithelial / LPF 11-20 0 - 5   Mucus PRESENT   CBC     Status: Abnormal   Collection Time: 06/06/21  8:13 PM  Result Value Ref Range   WBC 10.9 (H) 4.0 - 10.5 K/uL   RBC 3.29 (L) 3.87 - 5.11 MIL/uL   Hemoglobin 10.1 (L) 12.0 - 15.0 g/dL   HCT 76.1 (L) 95.0 - 93.2 %   MCV 95.4 80.0 - 100.0 fL   MCH 30.7 26.0 - 34.0 pg   MCHC 32.2 30.0 - 36.0 g/dL   RDW 67.1 24.5 - 80.9 %   Platelets 200 150 - 400 K/uL   nRBC 0.0 0.0 - 0.2 %   Discussed the anemia with patient, who is already on iron supplementation Discussed hydration, esp in high heat Discussed anxiety may also precipitate mild tachycardia, will rx Vistaril for prn use at home  Patient instructed to call office if not better in next few days.   Assessment and Plan  A:   Single IUP at [redacted]w[redacted]d        Episodes of mild tachycardia, normal sinus rhythm now        Mild anemia, on supplement        Anxiety  P:   Discharge home        Rx Vistaril prn anxiety        Continue iron supplements as ordered        Followup in office         Encouraged to return if she develops worsening of symptoms, increase in pain, fever, or other concerning symptoms.     Charlesetta Garibaldi Kooistra 06/06/2021, 7:31 PM

## 2021-08-31 LAB — OB RESULTS CONSOLE GBS: GBS: NEGATIVE

## 2021-09-26 ENCOUNTER — Encounter (HOSPITAL_COMMUNITY): Payer: Self-pay | Admitting: Obstetrics & Gynecology

## 2021-09-30 ENCOUNTER — Inpatient Hospital Stay (HOSPITAL_COMMUNITY): Payer: Medicaid Other | Admitting: Anesthesiology

## 2021-09-30 ENCOUNTER — Encounter (HOSPITAL_COMMUNITY): Payer: Self-pay | Admitting: Obstetrics and Gynecology

## 2021-09-30 ENCOUNTER — Inpatient Hospital Stay (HOSPITAL_COMMUNITY)
Admission: AD | Admit: 2021-09-30 | Discharge: 2021-10-01 | DRG: 807 | Disposition: A | Discharge status: Home / Self Care | Payer: Medicaid Other | Attending: Obstetrics | Admitting: Obstetrics

## 2021-09-30 DIAGNOSIS — Z3A40 40 weeks gestation of pregnancy: Secondary | ICD-10-CM

## 2021-09-30 DIAGNOSIS — O26893 Other specified pregnancy related conditions, third trimester: Secondary | ICD-10-CM | POA: Diagnosis present

## 2021-09-30 DIAGNOSIS — Z23 Encounter for immunization: Secondary | ICD-10-CM | POA: Diagnosis not present

## 2021-09-30 DIAGNOSIS — O9902 Anemia complicating childbirth: Secondary | ICD-10-CM | POA: Diagnosis present

## 2021-09-30 DIAGNOSIS — Z20822 Contact with and (suspected) exposure to covid-19: Secondary | ICD-10-CM | POA: Diagnosis present

## 2021-09-30 DIAGNOSIS — O480 Post-term pregnancy: Secondary | ICD-10-CM | POA: Diagnosis present

## 2021-09-30 LAB — CBC
HCT: 32.8 % — ABNORMAL LOW (ref 36.0–46.0)
Hemoglobin: 10.7 g/dL — ABNORMAL LOW (ref 12.0–15.0)
MCH: 30 pg (ref 26.0–34.0)
MCHC: 32.6 g/dL (ref 30.0–36.0)
MCV: 91.9 fL (ref 80.0–100.0)
Platelets: 189 10*3/uL (ref 150–400)
RBC: 3.57 MIL/uL — ABNORMAL LOW (ref 3.87–5.11)
RDW: 13.7 % (ref 11.5–15.5)
WBC: 8.8 10*3/uL (ref 4.0–10.5)
nRBC: 0 % (ref 0.0–0.2)

## 2021-09-30 LAB — RESP PANEL BY RT-PCR (FLU A&B, COVID) ARPGX2
Influenza A by PCR: NEGATIVE
Influenza B by PCR: NEGATIVE
SARS Coronavirus 2 by RT PCR: NEGATIVE

## 2021-09-30 LAB — POCT FERN TEST: POCT Fern Test: POSITIVE

## 2021-09-30 LAB — TYPE AND SCREEN
ABO/RH(D): B POS
Antibody Screen: NEGATIVE

## 2021-09-30 MED ORDER — TERBUTALINE SULFATE 1 MG/ML IJ SOLN: 0.2500 mg | Freq: Once | INTRAMUSCULAR | Status: DC | PRN

## 2021-09-30 MED ORDER — DIBUCAINE (PERIANAL) 1 % EX OINT: 1.0000 "application " | TOPICAL_OINTMENT | CUTANEOUS | Status: DC | PRN

## 2021-09-30 MED ORDER — ACETAMINOPHEN 325 MG PO TABS: 650.0000 mg | ORAL_TABLET | ORAL | Status: DC | PRN

## 2021-09-30 MED ORDER — LIDOCAINE HCL (PF) 1 % IJ SOLN
INTRAMUSCULAR | Status: DC | PRN
  Administered 2021-09-30: 8 mL via EPIDURAL

## 2021-09-30 MED ORDER — HYDROXYZINE HCL 25 MG PO TABS: 25.0000 mg | ORAL_TABLET | Freq: Four times a day (QID) | ORAL | Status: DC | PRN

## 2021-09-30 MED ORDER — TETANUS-DIPHTH-ACELL PERTUSSIS 5-2.5-18.5 LF-MCG/0.5 IM SUSY
0.5000 mL | PREFILLED_SYRINGE | Freq: Once | INTRAMUSCULAR | Status: AC
  Administered 2021-10-01: 0.5 mL via INTRAMUSCULAR
  Filled 2021-09-30: qty 0.5

## 2021-09-30 MED ORDER — ONDANSETRON HCL 4 MG/2ML IJ SOLN: 4.0000 mg | INTRAMUSCULAR | Status: DC | PRN

## 2021-09-30 MED ORDER — PHENYLEPHRINE 40 MCG/ML (10ML) SYRINGE FOR IV PUSH (FOR BLOOD PRESSURE SUPPORT): 80.0000 ug | PREFILLED_SYRINGE | INTRAVENOUS | Status: DC | PRN

## 2021-09-30 MED ORDER — DIPHENHYDRAMINE HCL 25 MG PO CAPS: 25.0000 mg | ORAL_CAPSULE | Freq: Four times a day (QID) | ORAL | Status: DC | PRN

## 2021-09-30 MED ORDER — FENTANYL CITRATE (PF) 100 MCG/2ML IJ SOLN: 50.0000 ug | INTRAMUSCULAR | Status: DC | PRN

## 2021-09-30 MED ORDER — LACTATED RINGERS IV SOLN
500.0000 mL | Freq: Once | INTRAVENOUS | Status: AC
  Administered 2021-09-30: 500 mL via INTRAVENOUS

## 2021-09-30 MED ORDER — ZOLPIDEM TARTRATE 5 MG PO TABS: 5.0000 mg | ORAL_TABLET | Freq: Every evening | ORAL | Status: DC | PRN

## 2021-09-30 MED ORDER — LIDOCAINE HCL (PF) 1 % IJ SOLN: 30.0000 mL | INTRAMUSCULAR | Status: DC | PRN

## 2021-09-30 MED ORDER — LACTATED RINGERS IV SOLN: 500.0000 mL | INTRAVENOUS | Status: DC | PRN

## 2021-09-30 MED ORDER — OXYCODONE-ACETAMINOPHEN 5-325 MG PO TABS: 1.0000 | ORAL_TABLET | ORAL | Status: DC | PRN

## 2021-09-30 MED ORDER — FENTANYL-BUPIVACAINE-NACL 0.5-0.125-0.9 MG/250ML-% EP SOLN
12.0000 mL/h | EPIDURAL | Status: DC | PRN
  Administered 2021-09-30: 12 mL/h via EPIDURAL
  Filled 2021-09-30: qty 250

## 2021-09-30 MED ORDER — BENZOCAINE-MENTHOL 20-0.5 % EX AERO
1.0000 "application " | INHALATION_SPRAY | CUTANEOUS | Status: DC | PRN
  Administered 2021-09-30: 1 via TOPICAL
  Filled 2021-09-30 (×2): qty 56

## 2021-09-30 MED ORDER — LACTATED RINGERS IV SOLN: INTRAVENOUS | Status: DC

## 2021-09-30 MED ORDER — ONDANSETRON HCL 4 MG/2ML IJ SOLN: 4.0000 mg | Freq: Four times a day (QID) | INTRAMUSCULAR | Status: DC | PRN

## 2021-09-30 MED ORDER — SOD CITRATE-CITRIC ACID 500-334 MG/5ML PO SOLN: 30.0000 mL | ORAL | Status: DC | PRN

## 2021-09-30 MED ORDER — SIMETHICONE 80 MG PO CHEW
80.0000 mg | CHEWABLE_TABLET | ORAL | Status: DC | PRN
  Filled 2021-09-30: qty 1

## 2021-09-30 MED ORDER — WITCH HAZEL-GLYCERIN EX PADS: 1.0000 "application " | MEDICATED_PAD | CUTANEOUS | Status: DC | PRN

## 2021-09-30 MED ORDER — ONDANSETRON HCL 4 MG PO TABS: 4.0000 mg | ORAL_TABLET | ORAL | Status: DC | PRN

## 2021-09-30 MED ORDER — EPHEDRINE 5 MG/ML INJ
10.0000 mg | INTRAVENOUS | Status: DC | PRN
Start: 2021-09-30 — End: 2021-09-30

## 2021-09-30 MED ORDER — DIPHENHYDRAMINE HCL 50 MG/ML IJ SOLN: 12.5000 mg | INTRAMUSCULAR | Status: DC | PRN

## 2021-09-30 MED ORDER — IBUPROFEN 600 MG PO TABS
600.0000 mg | ORAL_TABLET | Freq: Four times a day (QID) | ORAL | Status: DC
  Administered 2021-09-30 – 2021-10-01 (×5): 600 mg via ORAL
  Filled 2021-09-30 (×5): qty 1

## 2021-09-30 MED ORDER — EPHEDRINE 5 MG/ML INJ: 10.0000 mg | INTRAVENOUS | Status: DC | PRN

## 2021-09-30 MED ORDER — OXYCODONE-ACETAMINOPHEN 5-325 MG PO TABS: 2.0000 | ORAL_TABLET | ORAL | Status: DC | PRN

## 2021-09-30 MED ORDER — OXYTOCIN-SODIUM CHLORIDE 30-0.9 UT/500ML-% IV SOLN
2.5000 [IU]/h | INTRAVENOUS | Status: DC
  Administered 2021-09-30: 2.5 [IU]/h via INTRAVENOUS
  Filled 2021-09-30: qty 500

## 2021-09-30 MED ORDER — SENNOSIDES-DOCUSATE SODIUM 8.6-50 MG PO TABS
2.0000 | ORAL_TABLET | Freq: Every day | ORAL | Status: DC
  Administered 2021-10-01: 2 via ORAL
  Filled 2021-09-30: qty 2

## 2021-09-30 MED ORDER — COCONUT OIL OIL: 1.0000 "application " | TOPICAL_OIL | Status: DC | PRN

## 2021-09-30 MED ORDER — PRENATAL MULTIVITAMIN CH
1.0000 | ORAL_TABLET | Freq: Every day | ORAL | Status: DC
  Administered 2021-10-01: 1 via ORAL
  Filled 2021-09-30: qty 1

## 2021-09-30 MED ORDER — OXYTOCIN BOLUS FROM INFUSION
333.0000 mL | Freq: Once | INTRAVENOUS | Status: AC
  Administered 2021-09-30: 333 mL via INTRAVENOUS

## 2021-09-30 MED ORDER — MISOPROSTOL 25 MCG QUARTER TABLET: 25.0000 ug | ORAL_TABLET | ORAL | Status: DC | PRN

## 2021-09-30 MED ORDER — PHENYLEPHRINE 40 MCG/ML (10ML) SYRINGE FOR IV PUSH (FOR BLOOD PRESSURE SUPPORT)
80.0000 ug | PREFILLED_SYRINGE | INTRAVENOUS | Status: DC | PRN
Start: 2021-09-30 — End: 2021-09-30
  Filled 2021-09-30: qty 10

## 2021-09-30 NOTE — MAU Note (Signed)
Pt reports to mau with c/o possible SROM this morning at 0730.  Reports some irreg ctx.  +FM

## 2021-09-30 NOTE — Progress Notes (Signed)
Pt informed that the ultrasound is considered a limited OB ultrasound and is not intended to be a complete ultrasound exam.  Patient also informed that the ultrasound is not being completed with the intent of assessing for fetal or placental anomalies or any pelvic abnormalities.  Explained that the purpose of today's ultrasound is to assess for  presentation.  Patient acknowledges the purpose of the exam and the limitations of the study.    Vertex presentation confirmed.  Rolm Bookbinder, CNM 09/30/21 10:42 AM

## 2021-09-30 NOTE — Lactation Note (Addendum)
This note was copied from a baby's chart. Lactation Consultation Note  Patient Name: Christine [name] Date: 09/30/2021 Reason for consult: L&D Initial assessment;Term Age:30 hours  Consult was done in [place], mother declined Arabic interpreter:  L&D consult with >30 minutes old infant and P2 mother. Congratulated family on newborn.  Offered assistance with latch, mother declines at this time. Mother may request latch assistance later.  Discussed STS as ideal transition for infants after birth helping with temperature, blood sugar and comfort. Talked about primal reflexes such as rooting, hands to mouth, searching for the breast among others. Explained LC services availability during postpartum stay. Thanked family for their time.      Maternal Data Has patient been taught Hand Expression?: No Does the patient have breastfeeding experience prior to this delivery?: Yes How long did the patient breastfeed?: a couple of weeks  Feeding Mother's Current Feeding Choice: Breast Milk and Formula  Interventions Interventions: Skin to skin;Breast feeding basics reviewed;Education  Discharge Pump: Personal WIC Program: No (plans to apply tomorrow)  Consult Status Consult Status: Follow-up from L&D    Christine [name] A Christine [name] 09/30/2021, 3:07 PM

## 2021-09-30 NOTE — Lactation Note (Signed)
This note was copied from a baby's chart. Lactation Consultation Note  Patient Name: Christine [name] [id number] Date: 09/30/2021 Reason for consult: Initial assessment;Mother's request;Term Age:30 hours  LC assisted with latch with signs of milk transfer. Mom breast and formula. Hand pump provided to post pump to increase milk supply. During visit, BF going well Mom hold off on use of formula and will try BF offering back EBM with hand expression or hand pump after latching.  Plan 1. To feed based on cues 8-12x 24 hr period. Mom to offer breasts and look for signs of milk transfer.,  2. If unable to latch or offer more volume, hand express and give colostrum on spoon.  3. I and O sheet reviewed.  All questions answered at the end of the visit.   Maternal Data Has patient been taught Hand Expression?: Yes Does the patient have breastfeeding experience prior to this delivery?: Yes How long did the patient breastfeed?: 10 days unable to get infant to latch  Feeding Mother's Current Feeding Choice: Breast Milk and Formula  LATCH Score Latch: Repeated attempts needed to sustain latch, nipple held in mouth throughout feeding, stimulation needed to elicit sucking reflex.  Audible Swallowing: Spontaneous and intermittent  Type of Nipple: Everted at rest and after stimulation  Comfort (Breast/Nipple): Soft / non-tender  Hold (Positioning): Assistance needed to correctly position infant at breast and maintain latch.  LATCH Score: 8   Lactation Tools Discussed/Used    Interventions Interventions: Breast feeding basics reviewed;Education;Support pillows;Assisted with latch;Position options;Skin to skin;Expressed milk;Breast massage;Hand express;Breast compression;Hand pump;Infant Driven Feeding Algorithm education;Adjust position  Discharge    Consult Status Consult Status: Follow-up Date: 10/01/21 Follow-up type: In-patient    Christine Wiehe  [name] 09/30/2021, 7:21  PM

## 2021-09-30 NOTE — Anesthesia Preprocedure Evaluation (Signed)
Anesthesia Evaluation  Patient identified by MRN, date of birth, ID band Patient awake    Reviewed: Allergy & Precautions, H&P , NPO status , Patient's Chart, lab work & pertinent test results  History of Anesthesia Complications Negative for: history of anesthetic complications  Airway Mallampati: II  TM Distance: >3 FB Neck ROM: full    Dental no notable dental hx. (+) Teeth Intact   Pulmonary neg pulmonary ROS,    Pulmonary exam normal breath sounds clear to auscultation       Cardiovascular negative cardio ROS Normal cardiovascular exam Rhythm:regular Rate:Normal     Neuro/Psych Anxiety negative neurological ROS     GI/Hepatic negative GI ROS, Neg liver ROS,   Endo/Other  negative endocrine ROS  Renal/GU negative Renal ROS  negative genitourinary   Musculoskeletal   Abdominal   Peds  Hematology  (+) anemia ,   Anesthesia Other Findings   Reproductive/Obstetrics (+) Pregnancy                             Anesthesia Physical  Anesthesia Plan  ASA: 2  Anesthesia Plan: Epidural   Post-op Pain Management:    Induction:   PONV Risk Score and Plan:   Airway Management Planned:   Additional Equipment:   Intra-op Plan:   Post-operative Plan:   Informed Consent: I have reviewed the patients History and Physical, chart, labs and discussed the procedure including the risks, benefits and alternatives for the proposed anesthesia with the patient or authorized representative who has indicated his/her understanding and acceptance.       Plan Discussed with: Anesthesiologist  Anesthesia Plan Comments:         Anesthesia Quick Evaluation

## 2021-09-30 NOTE — Progress Notes (Signed)
Patient stated that no interpeter needed. She stated that she speaks and understands english well. Effective communication maintained.

## 2021-09-30 NOTE — Anesthesia Procedure Notes (Signed)
Epidural Patient location during procedure: OB Start time: 09/30/2021 12:17 PM End time: 09/30/2021 12:27 PM  Staffing Anesthesiologist: Mellody Dance, MD Performed: anesthesiologist   Preanesthetic Checklist Completed: patient identified, IV checked, site marked, risks and benefits discussed, monitors and equipment checked, pre-op evaluation and timeout performed  Epidural Patient position: sitting Prep: DuraPrep Patient monitoring: heart rate, cardiac monitor, continuous pulse ox and blood pressure Approach: midline Location: L3-L4 Injection technique: LOR saline  Needle:  Needle type: Tuohy  Needle gauge: 17 G Needle length: 9 cm Needle insertion depth: 4 cm Catheter type: closed end flexible Catheter size: 20 Guage Catheter at skin depth: 8 cm Test dose: negative and Other  Assessment Events: blood not aspirated, injection not painful, no injection resistance and negative IV test  Additional Notes Informed consent obtained prior to proceeding including risk of failure, 1% risk of PDPH, risk of minor discomfort and bruising.  Discussed rare but serious complications including epidural abscess, permanent nerve injury, epidural hematoma.  Discussed alternatives to epidural analgesia and patient desires to proceed.  Timeout performed pre-procedure verifying patient name, procedure, and platelet count.  Patient tolerated procedure well.

## 2021-09-30 NOTE — H&P (Signed)
Christine [name] is a 30 y.o. female presenting for rupture of membranes clear at 0730. Irr ctx that are now getting stronger. Denies VB, endorses +FM   PNC c/b mild anemia. GBS neg OB History     Gravida  3   Para  1   Term  1   Preterm  0   AB  0   Living  1      SAB  0   IAB  0   Ectopic  0   Multiple      Live Births  1          Past Medical History:  Diagnosis Date   Anemia    Anxiety    Past Surgical History:  Procedure Laterality Date   NO PAST SURGERIES     Family History: family history is not on file. Social History:  reports that she has never smoked. She has never used smokeless tobacco. She reports that she does not currently use alcohol. She reports that she does not currently use drugs.     Maternal Diabetes: No 1hr 113 Genetic Screening: Normal Maternal Ultrasounds/Referrals: Normal Fetal Ultrasounds or other Referrals:  None Maternal Substance Abuse:  No Significant Maternal Medications:  None Significant Maternal Lab Results:  Group B Strep negative Other Comments:  None  Review of Systems  Constitutional:  Negative for chills and fever.  Respiratory:  Negative for shortness of breath.   Cardiovascular:  Negative for chest pain, palpitations and leg swelling.  Gastrointestinal:  Negative for abdominal pain and vomiting.  Neurological:  Negative for dizziness, weakness and headaches.  Psychiatric/Behavioral:  Negative for suicidal ideas.   Maternal Medical History:  Reason for admission: Rupture of membranes.   Contractions: Onset was 1-2 hours ago.   Frequency: irregular.   Fetal activity: Perceived fetal activity is normal.   Prenatal complications: No bleeding.   Prenatal Complications - Diabetes: none.  Dilation: 10 Effacement (%): 100 Station: Plus 2 Exam by:: Sherol Dade RN Blood pressure 112/61, pulse 77, temperature 98.8 F (37.1 C), temperature source Oral, resp. rate 17, height 5' 0.63" (1.54 m), weight 73.9 kg,  SpO2 100 %, unknown if currently breastfeeding. Exam Physical Exam Constitutional:      General: She is not in acute distress.    Appearance: She is well-developed.  HENT:     Head: Normocephalic and atraumatic.  Eyes:     Pupils: Pupils are equal, round, and reactive to light.  Cardiovascular:     Rate and Rhythm: Normal rate and regular rhythm.     Heart sounds: No murmur heard.   No gallop.  Abdominal:     Tenderness: There is no abdominal tenderness. There is no guarding or rebound.  Genitourinary:    Vagina: Normal.  Musculoskeletal:        General: Normal range of motion.     Cervical back: Normal range of motion and neck supple.  Skin:    General: Skin is warm and dry.  Neurological:     Mental Status: She is alert and oriented to person, place, and time.    Prenatal labs: ABO, Rh: --/--/B POS (10/16 1010) Antibody: NEG (10/16 1010) Rubella: Immune (03/31 0000) RPR: Nonreactive (03/31 0000)  HBsAg: Negative (03/31 0000)  HIV: Non-reactive (03/31 0000)  GBS: Negative/-- (09/16 0000)   Assessment/Plan: This is a 30yo G3P1011 @ 40 2/7 by LMP c/w TVUS who was admitted with SROM. Was allowed to be checked after epidural placement, found to be 6cm  and quickly progressed to complete on her own after epidural placement. GBS neg.   Christine [name] Christine [name] 09/30/2021, 2:53 PM

## 2021-10-01 LAB — CBC
HCT: 26.5 % — ABNORMAL LOW (ref 36.0–46.0)
Hemoglobin: 8.7 g/dL — ABNORMAL LOW (ref 12.0–15.0)
MCH: 29.6 pg (ref 26.0–34.0)
MCHC: 32.8 g/dL (ref 30.0–36.0)
MCV: 90.1 fL (ref 80.0–100.0)
Platelets: 173 10*3/uL (ref 150–400)
RBC: 2.94 MIL/uL — ABNORMAL LOW (ref 3.87–5.11)
RDW: 13.6 % (ref 11.5–15.5)
WBC: 12.9 10*3/uL — ABNORMAL HIGH (ref 4.0–10.5)
nRBC: 0 % (ref 0.0–0.2)

## 2021-10-01 LAB — RPR: RPR Ser Ql: NONREACTIVE

## 2021-10-01 MED ORDER — IBUPROFEN 600 MG PO TABS: 600.0000 mg | ORAL_TABLET | Freq: Four times a day (QID) | ORAL | 0 refills | Status: AC

## 2021-10-01 NOTE — Social Work (Signed)
CSW received consult for hx of Anxiety. CSW met with MOB to offer support and complete assessment.    MOB declined Arabic Interpreter because she understands [place].   CSW met with MOB at bedside and introduced CSW role. CSW congratulated MOB and FOB. CSW offered MOB privacy. MOB welcomed CSW to complete the assessment with FOB present. MOB presented calm and receptive to [place] visit. CSW inquired how MOB has felt since giving birth. MOB expressed feeling good. MOB shared this L&D went much better than her last since she was more prepared. MOB and FOB both expressed excitement about having a baby girl. CSW inquired about MOB history of anxiety. MOB disclosed she experienced anxiety for the first time the during pregnancy. MOB expressed having a racing heart and shared it happened one time then went away.  CSW inquired about what may have triggered the anxiety. MOB shared she is not sure because it only happened one time and she has not had concerns since then. CSW inquired if MOB experienced postpartum depression. MOB denied experienced PPD. CSW provided education regarding the baby blues period vs. perinatal mood disorders, discussed treatment and gave resources for mental health follow up if concerns arise. FOB informed CSW that he is very attentive to MOB and will help monitor her as well. CSW recommended MOB complete a self-evaluation during the postpartum time period using the New Mom Checklist from Postpartum Progress and encouraged MOB to contact a medical professional if symptoms are noted at any time. MOB reported she feels comfortable reaching out to her doctor is she has concerns. CSW assessed MOB for safety. MOB denied thoughts of harm to self and others. CSW inquired about MOB supports. MOB identified her spouse as her primary support.   CSW provided review of Sudden Infant Death Syndrome (SIDS) precautions. MOB shared she has essential items for the infant including a bassinet where the infant  will sleep. MOB has [facility] children for infant's follow up care. CSW assessed MOB for additional needs. MOB reported no further needs.   CSW identifies no further need for intervention and no barriers to discharge at this time.   Kathrin Greathouse, MSW, LCSW Women's and [employer] Worker  [phone] 10/01/2021  10:55 AM

## 2021-10-01 NOTE — Lactation Note (Signed)
This note was copied from a baby's chart. Lactation Consultation Note  Patient Name: Christine [name] [id number] Date: 10/01/2021 Reason for consult: Follow-up assessment Age:30 hours   P2 mother whose infant is now 26 hours old.  This is a term baby at 40+2 weeks.    Mother was on the phone when I arrived.  She did not desire an Arabic interpreter.  Baby was swaddled and asleep in mother's arms.  Mother had no questions/concerns related to breast feeding.  She did not desire any assistance.  Mother is familiar with hand expression and did not require any review.  Offered to return as needed for latch assistance.    No support person present at this time.     Maternal Data    Feeding    LATCH Score                    Lactation Tools Discussed/Used    Interventions    Discharge    Consult Status Consult Status: Follow-up Date: 10/02/21 Follow-up type: In-patient    [name] 10/01/2021, 12:56 PM

## 2021-10-01 NOTE — Discharge Instructions (Signed)
As per discharge pamphlet °

## 2021-10-01 NOTE — Progress Notes (Signed)
PPD #1 No problems, wants to go home today Afeb, VSS Fundus firm, NT at U-1 Continue routine postpartum care, d/c home this pm if baby ok to go 

## 2021-10-01 NOTE — Discharge Summary (Signed)
Postpartum Discharge Summary      Patient Name: Christine [name] DOB: 1991-05-12 MRN: [id number]  Date of admission: 09/30/2021 Delivery date:09/30/2021  Delivering provider: Carlisle Cater  Date of discharge: 10/01/2021  Admitting diagnosis: Post term pregnancy [O48.0] Intrauterine pregnancy: [redacted]w[redacted]d     Secondary diagnosis:  Active Problems:   Post term pregnancy     Discharge diagnosis: Term Pregnancy Delivered                                               Hospital course: Onset of Labor With Vaginal Delivery      30 y.o. yo [id number] at [redacted]w[redacted]d was admitted in Active Labor on 09/30/2021. Patient had an uncomplicated labor course as follows:  Membrane Rupture Time/Date: 7:30 AM ,09/30/2021   Delivery Method:Vaginal, Spontaneous  Episiotomy: None  Lacerations:  2nd degree  Patient had an uncomplicated postpartum course.  She is ambulating, tolerating a regular diet, passing flatus, and urinating well. Patient is discharged home in stable condition on 10/01/21.  Newborn Data: Birth date:09/30/2021  Birth time:2:36 PM  Gender:Female  Living status:Living  Apgars:9 ,9  Weight:3374 g   Physical exam  Vitals:   09/30/21 1755 09/30/21 2200 10/01/21 0149 10/01/21 0533  BP: 122/75 115/70 (!) 106/50 113/77  Pulse: 91 85 66 74  Resp: 16 18 16 16   Temp: 98.4 F (36.9 C) 98.1 F (36.7 C) (!) 97.4 F (36.3 C) (!) 97.5 F (36.4 C)  TempSrc: Oral Oral  Oral  SpO2: 99% 99% 100% 99%  Weight:      Height:       General: alert Lochia: appropriate Uterine Fundus: firm  Labs: Lab Results  Component Value Date   WBC 12.9 (H) 10/01/2021   HGB 8.7 (L) 10/01/2021   HCT 26.5 (L) 10/01/2021   MCV 90.1 10/01/2021   PLT 173 10/01/2021   CMP Latest Ref Rng & Units 05/20/2020  Glucose 70 - 99 mg/dL 97  BUN 6 - 20 mg/dL 07/20/2020)  Creatinine <3(I - 1.00 mg/dL 9.51  Sodium 8.84 - 166 mmol/L 137  Potassium 3.5 - 5.1 mmol/L 4.8  Chloride 98 - 111 mmol/L 101  CO2 22 - 32 mmol/L 23   Calcium 8.9 - 10.3 mg/dL 063  Total Protein 6.5 - 8.1 g/dL 7.8  Total Bilirubin 0.3 - 1.2 mg/dL 0.7  Alkaline Phos 38 - 126 U/L 60  AST 15 - 41 U/L 17  ALT 0 - 44 U/L 14   Edinburgh Score: Edinburgh Postnatal Depression Scale Screening Tool 11/27/2017  I have been able to laugh and see the funny side of things. 0  I have looked forward with enjoyment to things. 0  I have blamed myself unnecessarily when things went wrong. 0  I have been anxious or worried for no good reason. 0  I have felt scared or panicky for no good reason. 0  Things have been getting on top of me. 0  I have been so unhappy that I have had difficulty sleeping. 0  I have felt sad or miserable. 0  I have been so unhappy that I have been crying. 0  The thought of harming myself has occurred to me. 0  Edinburgh Postnatal Depression Scale Total 0      After visit meds:  Allergies as of 10/01/2021   No Known Allergies  Medication List     TAKE these medications    ferrous sulfate 325 (65 FE) MG tablet Take 325 mg by mouth 3 (three) times daily with meals.   hydrOXYzine 25 MG tablet Commonly known as: ATARAX/VISTARIL Take 1 tablet (25 mg total) by mouth every 6 (six) hours as needed for anxiety.   ibuprofen 600 MG tablet Commonly known as: ADVIL Take 1 tablet (600 mg total) by mouth every 6 (six) hours.   prenatal multivitamin Tabs tablet Take 1 tablet daily at 12 noon by mouth.         Discharge home in stable condition Infant Feeding: Breast Infant Disposition:home with mother Discharge instruction: per After Visit Summary and Postpartum booklet. Activity: Advance as tolerated. Pelvic rest for 6 weeks.  Diet: routine diet Postpartum Appointment:4 weeks Follow up Visit:  Follow-up Information     Marlow Baars, MD. Schedule an appointment as soon as possible for a visit in 4 week(s).   Specialty: Obstetrics Contact information: [address] [place]  [address] [phone]                     10/01/2021 Zenaida Niece, MD

## 2021-10-01 NOTE — Anesthesia Postprocedure Evaluation (Signed)
Anesthesia Post Note  Patient: Agricultural consultant  Procedure(s) Performed: AN AD HOC LABOR EPIDURAL     Patient location during evaluation: Mother Baby Anesthesia Type: Epidural Level of consciousness: awake and alert Pain management: pain level controlled Vital Signs Assessment: post-procedure vital signs reviewed and stable Respiratory status: spontaneous breathing, nonlabored ventilation and respiratory function stable Cardiovascular status: stable Postop Assessment: no headache, no backache, patient able to bend at knees, no apparent nausea or vomiting, able to ambulate and adequate PO intake Anesthetic complications: no   No notable events documented.  Last Vitals:  Vitals:   10/01/21 0533 10/01/21 1404  BP: 113/77 109/75  Pulse: 74 73  Resp: 16 18  Temp: (!) 36.4 C 36.7 C  SpO2: 99% 100%    Last Pain:  Vitals:   10/01/21 1729  TempSrc:   PainSc: 0-No pain   Pain Goal: Patients Stated Pain Goal: 0 (09/30/21 1342)                 Blythe Stanford

## 2021-10-03 ENCOUNTER — Inpatient Hospital Stay (HOSPITAL_COMMUNITY): Payer: Medicaid Other

## 2021-10-03 ENCOUNTER — Inpatient Hospital Stay (HOSPITAL_COMMUNITY)
Admission: AD | Admit: 2021-10-03 | Payer: Medicaid Other | Source: Home / Self Care | Admitting: Obstetrics & Gynecology

## 2021-10-03 HISTORY — DX: Anxiety disorder, unspecified: F41.9

## 2021-10-12 ENCOUNTER — Telehealth (HOSPITAL_COMMUNITY): Payer: Self-pay | Admitting: *Deleted

## 2021-10-12 NOTE — Telephone Encounter (Signed)
Interpreter left message to return nurse call.  Duffy Rhody, RN 10-12-2021 at 12:22pm

## 2021-10-15 ENCOUNTER — Other Ambulatory Visit: Payer: Self-pay

## 2021-10-15 ENCOUNTER — Encounter (HOSPITAL_COMMUNITY): Payer: Self-pay

## 2021-10-15 ENCOUNTER — Emergency Department (HOSPITAL_COMMUNITY)
Admission: EM | Admit: 2021-10-15 | Discharge: 2021-10-15 | Disposition: A | Discharge status: Home / Self Care | Payer: Medicaid Other | Attending: Emergency Medicine | Admitting: Emergency Medicine

## 2021-10-15 DIAGNOSIS — Z5321 Procedure and treatment not carried out due to patient leaving prior to being seen by health care provider: Secondary | ICD-10-CM | POA: Insufficient documentation

## 2021-10-15 DIAGNOSIS — R109 Unspecified abdominal pain: Secondary | ICD-10-CM | POA: Diagnosis present

## 2021-10-15 DIAGNOSIS — R1110 Vomiting, unspecified: Secondary | ICD-10-CM | POA: Insufficient documentation

## 2021-10-15 LAB — URINALYSIS, ROUTINE W REFLEX MICROSCOPIC
Bilirubin Urine: NEGATIVE
Glucose, UA: NEGATIVE mg/dL
Ketones, ur: NEGATIVE mg/dL
Nitrite: NEGATIVE
Protein, ur: 100 mg/dL — AB
RBC / HPF: >50 RBC/hpf — ABNORMAL HIGH (ref 0–5)
Specific Gravity, Urine: 1.016 (ref 1.005–1.030)
WBC, UA: >50 WBC/hpf — ABNORMAL HIGH (ref 0–5)
pH: 6 (ref 5.0–8.0)

## 2021-10-15 LAB — COMPREHENSIVE METABOLIC PANEL
ALT: 15 U/L (ref 0–44)
AST: 17 U/L (ref 15–41)
Albumin: 3.5 g/dL (ref 3.5–5.0)
Alkaline Phosphatase: 93 U/L (ref 38–126)
Anion gap: 7 (ref 5–15)
BUN: 14 mg/dL (ref 6–20)
CO2: 27 mmol/L (ref 22–32)
Calcium: 9.2 mg/dL (ref 8.9–10.3)
Chloride: 106 mmol/L (ref 98–111)
Creatinine, Ser: 0.55 mg/dL (ref 0.44–1.00)
GFR, Estimated: >60 mL/min (ref >60)
Glucose, Bld: 101 mg/dL — ABNORMAL HIGH (ref 70–99)
Potassium: 3.8 mmol/L (ref 3.5–5.1)
Sodium: 140 mmol/L (ref 135–145)
Total Bilirubin: 0.5 mg/dL (ref 0.3–1.2)
Total Protein: 7.2 g/dL (ref 6.5–8.1)

## 2021-10-15 LAB — CBC WITH DIFFERENTIAL/PLATELET
Abs Immature Granulocytes: 0.05 10*3/uL (ref 0.00–0.07)
Basophils Absolute: 0 10*3/uL (ref 0.0–0.1)
Basophils Relative: 0 %
Eosinophils Absolute: 0.1 10*3/uL (ref 0.0–0.5)
Eosinophils Relative: 1 %
HCT: 35.1 % — ABNORMAL LOW (ref 36.0–46.0)
Hemoglobin: 11.1 g/dL — ABNORMAL LOW (ref 12.0–15.0)
Immature Granulocytes: 1 %
Lymphocytes Relative: 20 %
Lymphs Abs: 1.8 10*3/uL (ref 0.7–4.0)
MCH: 29.1 pg (ref 26.0–34.0)
MCHC: 31.6 g/dL (ref 30.0–36.0)
MCV: 91.9 fL (ref 80.0–100.0)
Monocytes Absolute: 0.5 10*3/uL (ref 0.1–1.0)
Monocytes Relative: 6 %
Neutro Abs: 6.7 10*3/uL (ref 1.7–7.7)
Neutrophils Relative %: 72 %
Platelets: 359 10*3/uL (ref 150–400)
RBC: 3.82 MIL/uL — ABNORMAL LOW (ref 3.87–5.11)
RDW: 13.1 % (ref 11.5–15.5)
WBC: 9.2 10*3/uL (ref 4.0–10.5)
nRBC: 0 % (ref 0.0–0.2)

## 2021-10-15 LAB — I-STAT BETA HCG BLOOD, ED (MC, WL, AP ONLY): I-stat hCG, quantitative: 11 m[IU]/mL — ABNORMAL HIGH (ref <5)

## 2021-10-15 LAB — PREGNANCY, URINE: Preg Test, Ur: NEGATIVE

## 2021-10-15 NOTE — ED Triage Notes (Signed)
Pt complains of left flank pain and vomiting for the past few hours.

## 2021-10-15 NOTE — ED Notes (Signed)
Labeled specimen cup provided to pt for urine collection per MD order. ENMiles 

## 2021-10-16 ENCOUNTER — Other Ambulatory Visit: Payer: Self-pay

## 2021-10-16 ENCOUNTER — Emergency Department (HOSPITAL_COMMUNITY)
Admission: EM | Admit: 2021-10-16 | Discharge: 2021-10-17 | Disposition: A | Discharge status: Home / Self Care | Payer: Medicaid Other | Attending: Emergency Medicine | Admitting: Emergency Medicine

## 2021-10-16 ENCOUNTER — Emergency Department (HOSPITAL_COMMUNITY): Payer: Medicaid Other

## 2021-10-16 DIAGNOSIS — Z5321 Procedure and treatment not carried out due to patient leaving prior to being seen by health care provider: Secondary | ICD-10-CM | POA: Insufficient documentation

## 2021-10-16 DIAGNOSIS — R109 Unspecified abdominal pain: Secondary | ICD-10-CM | POA: Insufficient documentation

## 2021-10-16 DIAGNOSIS — R110 Nausea: Secondary | ICD-10-CM | POA: Diagnosis not present

## 2021-10-16 LAB — URINALYSIS, ROUTINE W REFLEX MICROSCOPIC
Bilirubin Urine: NEGATIVE
Glucose, UA: NEGATIVE mg/dL
Ketones, ur: NEGATIVE mg/dL
Nitrite: NEGATIVE
Protein, ur: NEGATIVE mg/dL
Specific Gravity, Urine: 1.006 (ref 1.005–1.030)
WBC, UA: >50 WBC/hpf — ABNORMAL HIGH (ref 0–5)
pH: 7 (ref 5.0–8.0)

## 2021-10-16 NOTE — ED Triage Notes (Signed)
Pt with L flank pain since Sunday. Denies urinary symptoms or abdominal pain. 15 days postpartum.

## 2021-10-16 NOTE — ED Provider Notes (Signed)
Emergency Medicine Provider Triage Evaluation Note  Shenoa Hattabaugh , a 30 y.o. female  was evaluated in triage. Pt complains of left flank pain onset 2 days. Has associated resolved intermittent nausea. Pt gave birth ~15 days ago. Denies abdominal pain, dysuria, hematuria, vaginal discharge, or vaginal bleeding.   Review of Systems  Positive: Left flank pain and intermittent resolved nausea Negative: Abdominal pain, dysuria, hematuria, vaginal discharge, vaginal bleeding  Physical Exam  BP 134/90 (BP Location: Right Arm)   Pulse 75   Temp 98.4 F (36.9 C) (Oral)   Resp 16   SpO2 99%  Gen:   Awake, no distress   Resp:  Normal effort MSK:   Moves extremities without difficulty Other:  Left CVA tenderness. No abdominal tenderness, rigidity, or guarding. Sensation intact.  Medical Decision Making  Medically screening exam initiated at 1:11 PM.  Appropriate orders placed.  Zaley Talley was informed that the remainder of the evaluation will be completed by another provider, this initial triage assessment does not replace that evaluation, and the importance of remaining in the ED until their evaluation is complete.    Lewellyn Fultz A, [place] 10/16/21 1337    Pollyann Savoy, MD 10/16/21 [phone]

## 2021-10-17 NOTE — ED Notes (Signed)
Pt called for vitals no answer and not seen in lobby. °

## 2021-10-18 ENCOUNTER — Ambulatory Visit (HOSPITAL_COMMUNITY)
Admission: EM | Admit: 2021-10-18 | Discharge: 2021-10-18 | Disposition: A | Discharge status: Home / Self Care | Payer: Medicaid Other | Attending: Emergency Medicine | Admitting: Emergency Medicine

## 2021-10-18 ENCOUNTER — Encounter (HOSPITAL_COMMUNITY): Payer: Self-pay | Admitting: Emergency Medicine

## 2021-10-18 ENCOUNTER — Other Ambulatory Visit: Payer: Self-pay

## 2021-10-18 DIAGNOSIS — N390 Urinary tract infection, site not specified: Secondary | ICD-10-CM

## 2021-10-18 DIAGNOSIS — R319 Hematuria, unspecified: Secondary | ICD-10-CM | POA: Insufficient documentation

## 2021-10-18 DIAGNOSIS — N200 Calculus of kidney: Secondary | ICD-10-CM

## 2021-10-18 LAB — POCT URINALYSIS DIPSTICK, ED / UC
Bilirubin Urine: NEGATIVE
Glucose, UA: NEGATIVE mg/dL
Ketones, ur: NEGATIVE mg/dL
Nitrite: NEGATIVE
Protein, ur: NEGATIVE mg/dL
Specific Gravity, Urine: 1.025 (ref 1.005–1.030)
Urobilinogen, UA: 0.2 mg/dL (ref 0.0–1.0)
pH: 6 (ref 5.0–8.0)

## 2021-10-18 MED ORDER — CEPHALEXIN 500 MG PO CAPS: 500.0000 mg | ORAL_CAPSULE | Freq: Four times a day (QID) | ORAL | 0 refills | Status: AC

## 2021-10-18 MED ORDER — TAMSULOSIN HCL 0.4 MG PO CAPS: 0.4000 mg | ORAL_CAPSULE | Freq: Every day | ORAL | 0 refills | Status: AC

## 2021-10-18 NOTE — ED Provider Notes (Signed)
HPI  SUBJECTIVE:  Christine [name] is a 30 y.o. female who presents with 4 days of nonmigratory, nonradiating constant left lower back pain described as "pushing".  She reports nausea and one episode of emesis.  She states that the pain has gotten much better today.  No fevers, body aches, urinary complaints.  States that she is able to completely empty her bladder.  She went to the ER on 10/31 and 11/1.  She had a CT and urinalysis on 11/1 which showed a Nonobstructive 2 mm stone in the right mid kidney. Obstructing 4 mm stone in the left proximal ureter with upstream left-sided hydroureteronephrosis., large leukocytes and moderate hematuria.  Pregnancy was negative.  She has tried ibuprofen 600 mg once and pushing fluids with improvement in her symptoms.  No aggravating factors.  She has no past medical history of pyelonephritis, nephrolithiasis, UTI.  She is 2 weeks postpartum and is breast-feeding.  PMD: Jerelyn Charles, MD   Past Medical History:  Diagnosis Date   Anemia    Anxiety     Past Surgical History:  Procedure Laterality Date   NO PAST SURGERIES      Family History  Problem Relation Age of Onset   Healthy Mother    Healthy Father     Social History   Tobacco Use   Smoking status: Never   Smokeless tobacco: Never  Vaping Use   Vaping Use: Never used  Substance Use Topics   Alcohol use: Not Currently   Drug use: Not Currently    No current facility-administered medications for this encounter.  Current Outpatient Medications:    cephALEXin (KEFLEX) 500 MG capsule, Take 1 capsule (500 mg total) by mouth 4 (four) times daily for 10 days., Disp: 40 capsule, Rfl: 0   ibuprofen (ADVIL) 600 MG tablet, Take 1 tablet (600 mg total) by mouth every 6 (six) hours., Disp: 30 tablet, Rfl: 0   tamsulosin (FLOMAX) 0.4 MG CAPS capsule, Take 1 capsule (0.4 mg total) by mouth at bedtime for 7 days., Disp: 7 capsule, Rfl: 0  No Known Allergies   ROS  As noted in HPI.   Physical  Exam  BP 128/75 (BP Location: Right Arm)   Pulse 89   Temp 98.5 F (36.9 C) (Oral)   Resp 18   SpO2 100%   Constitutional: Well developed, well nourished, no acute distress Eyes:  EOMI, conjunctiva normal bilaterally HENT: Normocephalic, atraumatic,mucus membranes moist Respiratory: Normal inspiratory effort Cardiovascular: Normal rate GI: nondistended soft, nontender, no suprapubic flank tenderness Back: No CVAT. skin: No rash, skin intact Musculoskeletal: no deformities Neurologic: Alert & oriented x 3, no focal neuro deficits Psychiatric: Speech and behavior appropriate   ED Course   Medications - No data to display  Orders Placed This Encounter  Procedures   Urine Culture    Standing Status:   Standing    Number of Occurrences:   1    Order Specific Question:   Indication    Answer:   Flank Pain   Ambulatory referral to Urology    Referral Priority:   Routine    Referral Type:   Consultation    Referral Reason:   Specialty Services Required    Referred to Provider:   Remi Haggard, MD    Requested Specialty:   Urology    Number of Visits Requested:   1   Strain all urine    Standing Status:   Standing    Number of Occurrences:   1  POC Urinalysis dipstick    Standing Status:   Standing    Number of Occurrences:   1    Results for orders placed or performed during the hospital encounter of 10/18/21 (from the past 24 hour(s))  POC Urinalysis dipstick     Status: Abnormal   Collection Time: 10/18/21  1:58 PM  Result Value Ref Range   Glucose, UA NEGATIVE NEGATIVE mg/dL   Bilirubin Urine NEGATIVE NEGATIVE   Ketones, ur NEGATIVE NEGATIVE mg/dL   Specific Gravity, Urine 1.025 1.005 - 1.030   Hgb urine dipstick MODERATE (A) NEGATIVE   pH 6.0 5.0 - 8.0   Protein, ur NEGATIVE NEGATIVE mg/dL   Urobilinogen, UA 0.2 0.0 - 1.0 mg/dL   Nitrite NEGATIVE NEGATIVE   Leukocytes,Ua LARGE (A) NEGATIVE   CT Renal Stone Study  Result Date: 10/16/2021 CLINICAL  DATA:  Flank pain, kidney stone suspected EXAM: CT ABDOMEN AND PELVIS WITHOUT CONTRAST TECHNIQUE: Multidetector CT imaging of the abdomen and pelvis was performed following the standard protocol without IV contrast. COMPARISON:  None. FINDINGS: Lower chest: No acute abnormality. Hepatobiliary: No focal liver abnormality is seen. No gallstones, gallbladder wall thickening, or biliary dilatation. Pancreas: Unremarkable. No pancreatic ductal dilatation or surrounding inflammatory changes. Spleen: Normal in size without focal abnormality. Adrenals/Urinary Tract: Adrenal glands are unremarkable. There is an obstructing 4 mm stone in the left proximal ureter (series 3, image 34), with resultant left sided hydroureteronephrosis. There is a 2 mm nonobstructive stone in the right mid kidney. Bladder is unremarkable. Stomach/Bowel: The stomach is within normal limits. There is no evidence of bowel obstruction. Normal appendix. Vascular/Lymphatic: No significant vascular findings are present. No enlarged abdominal or pelvic lymph nodes. Reproductive: Endometrial thickening, which is considered normal for age. Other: No abdominal wall hernia or abnormality. No abdominopelvic ascites. Musculoskeletal: No acute or significant osseous findings. IMPRESSION: Obstructing 4 mm stone in the left proximal ureter with upstream left-sided hydroureteronephrosis. Nonobstructive 2 mm stone in the right mid kidney. Electronically Signed   By: Caprice Renshaw M.D.   On: 10/16/2021 15:10    ED Clinical Impression  1. Left nephrolithiasis   2. Urinary tract infection with hematuria, site unspecified      ED Assessment/Plan  Patient was seen in the ED on 10/31, 11/1 for left flank pain, left after triage.  She did not have a white count on 10/31.   Doubt obstruction, she states the pain is getting better, she has no suprapubic, flank or CVA tenderness.  She had no leukocytosis 2 days ago, she has not had any fevers during this time.  She  is afebrile here.  Her pain seems to be well under control with 1 dose of ibuprofen 600 mg.  discussed with Dr. Benancio Deeds, urology.  They will see her in the office within the next week.  Placing a referral.  In the meantime, strainer, push fluids, Flomax, Keflex, urine culture sent.  Ibuprofen/Tylenol together 3-4 times a day as needed for pain.  She states that she does not need any more.  ER return precautions given.   Discussed labs, imaging, MDM, treatment plan, and plan for follow-up with patient. Discussed sn/sx that should prompt return to the ED. patient agrees with plan.   Meds ordered this encounter  Medications   cephALEXin (KEFLEX) 500 MG capsule    Sig: Take 1 capsule (500 mg total) by mouth 4 (four) times daily for 10 days.    Dispense:  40 capsule    Refill:  0  tamsulosin (FLOMAX) 0.4 MG CAPS capsule    Sig: Take 1 capsule (0.4 mg total) by mouth at bedtime for 7 days.    Dispense:  7 capsule    Refill:  0      *This clinic note was created using [employer]. Therefore, there may be occasional mistakes despite careful proofreading.  ?    Melynda Ripple, MD 10/20/21 [phone]

## 2021-10-18 NOTE — Discharge Instructions (Addendum)
I am sending your urine off for culture.  I am starting you on antibiotics to treat a possible urinary tract infection.  May take 600 mg of ibuprofen combined with 1000 mg of Tylenol together 3-4 times a day as needed for pain.  Continue pushing extra fluids.  Flomax will also help you pass the stone.  It is not known if Flomax occurs in breastmilk, but if it does, should be in very small amounts.  Keep a close eye on your baby while taking this medication.  Since strain all of your urine, if you pass the stone, then bring it in with you when you see urology.  I have placed a referral to urology.  I have talked to them, and they would like to see you within a week.  Please go immediately to the ER for fevers above 100.4, if your pain is not controlled with Tylenol/ibuprofen, vomiting, or for any other concerns

## 2021-10-18 NOTE — ED Triage Notes (Addendum)
Stated having pain on Sunday night.  Patient went to ED on Tuesday, but did not see the physician.  Patient has left flank pain.  No pain with urination.  Reports urine samples obtained when she went to ED on Tuesday  Patient is 2 weeks postpartum.  Patient delivered vaginal

## 2021-10-18 NOTE — ED Notes (Signed)
Provided strainer for urine and a cup to put stone in if passed

## 2021-10-18 NOTE — ED Notes (Signed)
Called patient, coming in

## 2021-10-19 LAB — URINE CULTURE

## 2022-02-26 ENCOUNTER — Other Ambulatory Visit: Payer: Self-pay

## 2022-02-26 ENCOUNTER — Ambulatory Visit (HOSPITAL_COMMUNITY)
Admission: EM | Admit: 2022-02-26 | Discharge: 2022-02-26 | Disposition: A | Discharge status: Home / Self Care | Payer: Medicaid Other | Attending: Nurse Practitioner | Admitting: Nurse Practitioner

## 2022-02-26 ENCOUNTER — Encounter (HOSPITAL_COMMUNITY): Payer: Self-pay | Admitting: Emergency Medicine

## 2022-02-26 DIAGNOSIS — R112 Nausea with vomiting, unspecified: Secondary | ICD-10-CM | POA: Diagnosis present

## 2022-02-26 LAB — POCT URINALYSIS DIPSTICK, ED / UC
Bilirubin Urine: NEGATIVE
Glucose, UA: NEGATIVE mg/dL
Ketones, ur: NEGATIVE mg/dL
Nitrite: NEGATIVE
Protein, ur: 30 mg/dL — AB
Specific Gravity, Urine: 1.02 (ref 1.005–1.030)
Urobilinogen, UA: 1 mg/dL (ref 0.0–1.0)
pH: 8.5 — ABNORMAL HIGH (ref 5.0–8.0)

## 2022-02-26 LAB — POC URINE PREG, ED: Preg Test, Ur: NEGATIVE

## 2022-02-26 NOTE — ED Triage Notes (Signed)
Pt reports nausea and 1 episode of vomiting today. Pt states after vomiting she had left flank pain that has now resolved.  ?

## 2022-02-26 NOTE — ED Provider Notes (Signed)
?[place] ? ? ? ?CSN: [id number] ?Arrival date & time: 02/26/22  1849 ? ? ?  ? ?History   ?Chief Complaint ?Chief Complaint  ?Patient presents with  ? Nausea  ? ? ?HPI ?Christine [name] is a 31 y.o. female.  ? ?The patient is a 31 year old female who presents for 1 episode of nausea with vomiting today.  States that she developed some left flank pain that radiated to her left lower quadrant/pelvic region.  She took ibuprofen for her symptoms, and right after she took the ibuprofen she vomited.  She states after she vomited her symptoms improved.  She currently denies fever, chills, abdominal pain, nausea, vomiting, or diarrhea, URI or urinary symptoms.  She is currently breast-feeding, and states that she does not have a menstrual cycle.  She is 5 months postpartum.  She has not taken any medication for her symptoms.  Patient has a history of kidney stones in November, 2022. ? ? ? ?Past Medical History:  ?Diagnosis Date  ? Anemia   ? Anxiety   ? ? ?Patient Active Problem List  ? Diagnosis Date Noted  ? Post term pregnancy 09/30/2021  ? Indication for care in labor or delivery 11/25/2017  ? Pregnancy 11/25/2017  ? Vomiting 11/03/2017  ? ? ?Past Surgical History:  ?Procedure Laterality Date  ? NO PAST SURGERIES    ? ? ?OB History   ? ? Gravida  ?3  ? Para  ?2  ? Term  ?2  ? Preterm  ?0  ? AB  ?0  ? Living  ?2  ?  ? ? SAB  ?0  ? IAB  ?0  ? Ectopic  ?0  ? Multiple  ?0  ? Live Births  ?2  ?   ?  ?  ? ? ? ?Home Medications   ? ?Prior to Admission medications   ?Medication Sig Start Date End Date Taking? Authorizing Provider  ?ibuprofen (ADVIL) 600 MG tablet Take 1 tablet (600 mg total) by mouth every 6 (six) hours. 10/01/21   Meisinger, Sherren Mocha, MD  ? ? ?Family History ?Family History  ?Problem Relation Age of Onset  ? Healthy Mother   ? Healthy Father   ? ? ?Social History ?Social History  ? ?Tobacco Use  ? Smoking status: Never  ? Smokeless tobacco: Never  ?Vaping Use  ? Vaping Use: Never used  ?Substance Use Topics   ? Alcohol use: Not Currently  ? Drug use: Not Currently  ? ? ? ?Allergies   ?Patient has no known allergies. ? ? ?Review of Systems ?Review of Systems  ?Constitutional: Negative.   ?Respiratory: Negative.    ?Cardiovascular: Negative.   ?Gastrointestinal:  Positive for nausea and vomiting.  ?Genitourinary: Negative.   ?Skin: Negative.   ?Psychiatric/Behavioral: Negative.    ? ? ?Physical Exam ?Triage Vital Signs ?ED Triage Vitals  ?Enc Vitals Group  ?   BP 02/26/22 1938 124/82  ?   Pulse Rate 02/26/22 1938 83  ?   Resp 02/26/22 1938 16  ?   Temp 02/26/22 1938 97.7 ?F (36.5 ?C)  ?   Temp Source 02/26/22 1938 Oral  ?   SpO2 02/26/22 1938 99 %  ?   Weight 02/26/22 1937 162 lb 14.7 oz (73.9 kg)  ?   Height 02/26/22 1937 5' 0.63" (1.54 m)  ?   Head Circumference --   ?   Peak Flow --   ?   Pain Score 02/26/22 1937 4  ?  Pain Loc --   ?   Pain Edu? --   ?   Excl. in [place]? --   ? ?No data found. ? ?Updated Vital Signs ?BP 124/82 (BP Location: Left Arm)   Pulse 83   Temp 97.7 ?F (36.5 ?C) (Oral)   Resp 16   Ht 5' 0.63" (1.54 m)   Wt 162 lb 14.7 oz (73.9 kg)   SpO2 99%   BMI 31.16 kg/m?  ? ?Visual Acuity ?Right Eye Distance:   ?Left Eye Distance:   ?Bilateral Distance:   ? ?Right Eye Near:   ?Left Eye Near:    ?Bilateral Near:    ? ?Physical Exam ?Vitals reviewed.  ?Constitutional:   ?   General: She is not in acute distress. ?   Appearance: Normal appearance.  ?HENT:  ?   Head: Normocephalic and atraumatic.  ?Eyes:  ?   Extraocular Movements: Extraocular movements intact.  ?   Conjunctiva/sclera: Conjunctivae normal.  ?   Pupils: Pupils are equal, round, and reactive to light.  ?Cardiovascular:  ?   Rate and Rhythm: Normal rate and regular rhythm.  ?   Pulses: Normal pulses.  ?   Heart sounds: Normal heart sounds.  ?Pulmonary:  ?   Effort: Pulmonary effort is normal.  ?   Breath sounds: Normal breath sounds.  ?Abdominal:  ?   General: Bowel sounds are normal.  ?   Palpations: Abdomen is soft.  ?   Tenderness: There  is no abdominal tenderness. There is no right CVA tenderness or left CVA tenderness.  ?Musculoskeletal:  ?   Cervical back: Normal range of motion and neck supple.  ?Skin: ?   General: Skin is warm and dry.  ?Neurological:  ?   Mental Status: She is alert and oriented to person, place, and time.  ?Psychiatric:     ?   Mood and Affect: Mood normal.     ?   Behavior: Behavior normal.  ? ? ? ?UC Treatments / Results  ?Labs ?(all labs ordered are listed, but only abnormal results are displayed) ?Labs Reviewed  ?POCT URINALYSIS DIPSTICK, ED / UC - Abnormal; Notable for the following components:  ?    Result Value  ? Hgb urine dipstick SMALL (*)   ? pH 8.5 (*)   ? Protein, ur 30 (*)   ? Leukocytes,Ua SMALL (*)   ? All other components within normal limits  ?URINE CULTURE  ?POC URINE PREG, ED  ? ? ?EKG ? ? ?Radiology ?No results found. ? ?Procedures ?Procedures (including critical care time) ? ?Medications Ordered in UC ?Medications - No data to display ? ?Initial Impression / Assessment and Plan / UC Course  ?I have reviewed the triage vital signs and the nursing notes. ? ?Pertinent labs & imaging results that were available during my care of the patient were reviewed by me and considered in my medical decision making (see chart for details). ? ?Patient presents with 1 episode of nausea and vomiting that occurred approximately 3 hours prior to her arrival.  She states after she vomited her symptoms improved.  She also reports an episode of flank pain prior to the nausea and vomiting.  She states symptoms have since resolved.  The patient denies fever, chills, abdominal pain, or urinary symptoms.  Her urinalysis does show small blood and small leukocytes, will send for urine culture to determine if treatment needs to be provided.  The patient is not in any acute distress at this time, says  she does not present as if she does have a kidney stone currently.  We will have patient continue to monitor symptoms for any change in  her symptoms to include worsening flank pain, nausea, vomiting or urinary symptoms.  Patient encouraged to increase fluids in the interim and get plenty of rest.  Follow-up as needed. ?Final Clinical Impressions(s) / UC Diagnoses  ? ?Final diagnoses:  ?Nausea and vomiting, unspecified vomiting type  ? ?Discharge Instructions   ?None ?  ? ?ED Prescriptions   ?None ?  ? ?PDMP not reviewed this encounter. ?  ?Tish Men, NP ?02/26/22 2005 ? ?

## 2022-02-26 NOTE — Discharge Instructions (Addendum)
Your urine will be sent for urine culture for confirmatory testing. ?May continue to take ibuprofen or Tylenol as needed for pain, fever, or general discomfort. ?Increase fluids. ?Follow-up if symptoms return or worsen. ?

## 2022-02-28 LAB — URINE CULTURE

## 2022-04-21 IMAGING — CT CT RENAL STONE PROTOCOL
2 of 4 series · 16 of 46 positions shown, 18 images · non-contrast
Comparison: None.

CLINICAL DATA: Flank pain, kidney stone suspected

EXAM:
CT ABDOMEN AND PELVIS WITHOUT CONTRAST
TECHNIQUE: Multidetector CT imaging of the abdomen and pelvis was performed
following the standard protocol without IV contrast.

[Series 3: renal stone 5.0 · axial · 0.78mm/px · z∈[+868,+1248]mm · 13 of 84 slices shown, 15 images]
[im 4/84  soft-tissue]
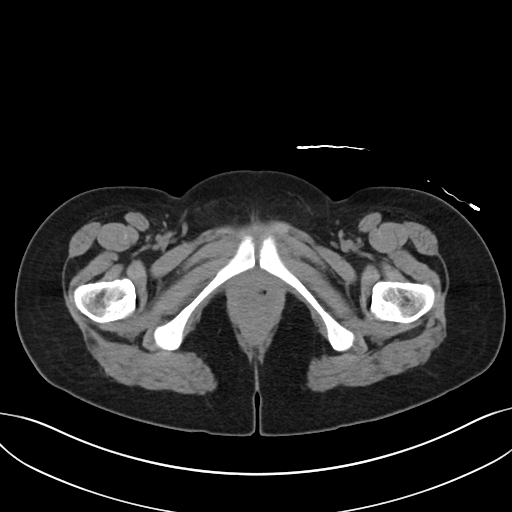
[im 4/84  bone]
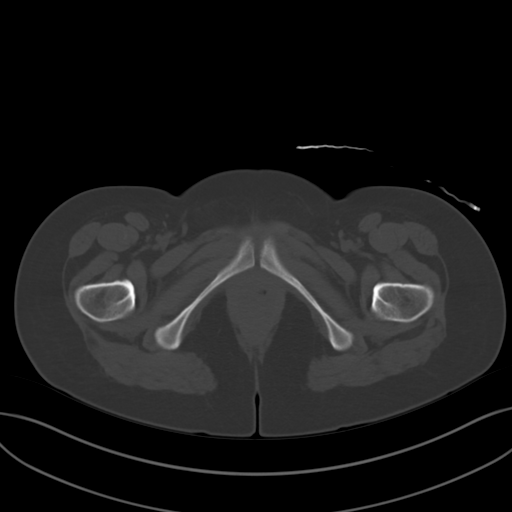
[im 11/84  soft-tissue]
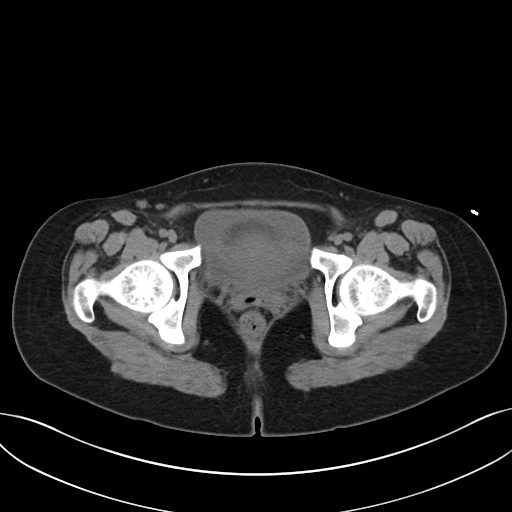
[im 18/84  soft-tissue]
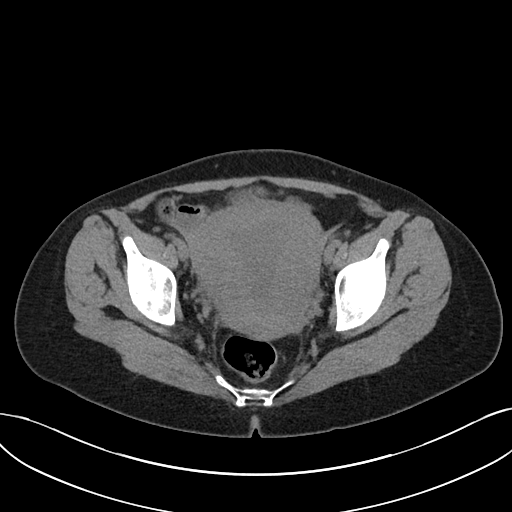
[im 25/84  soft-tissue]
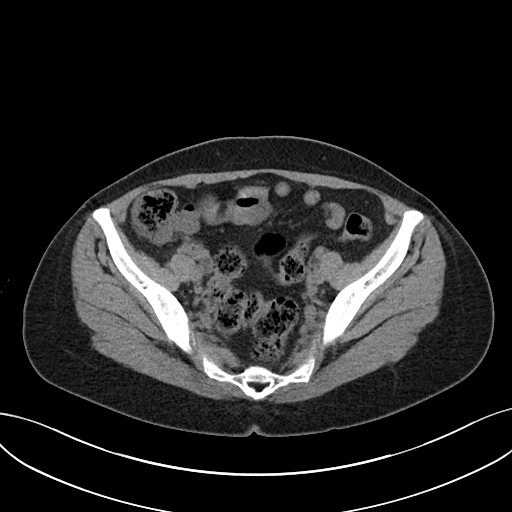
[im 28/84  soft-tissue]
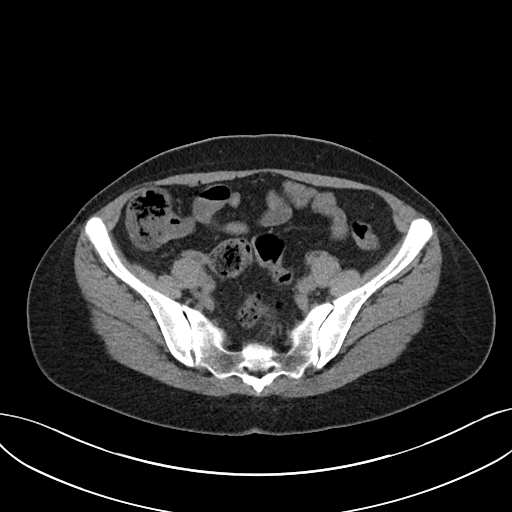
[im 35/84  soft-tissue]
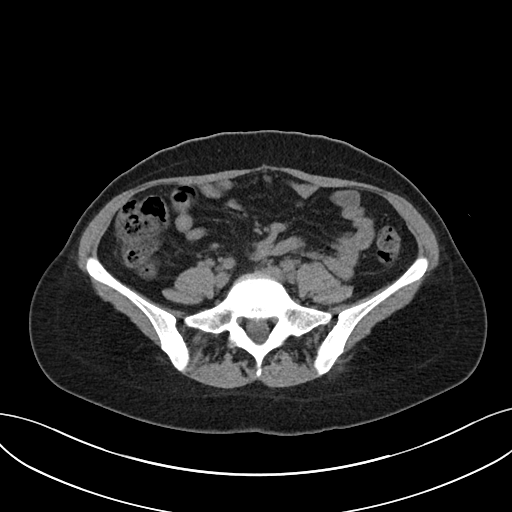
[im 42/84  soft-tissue]
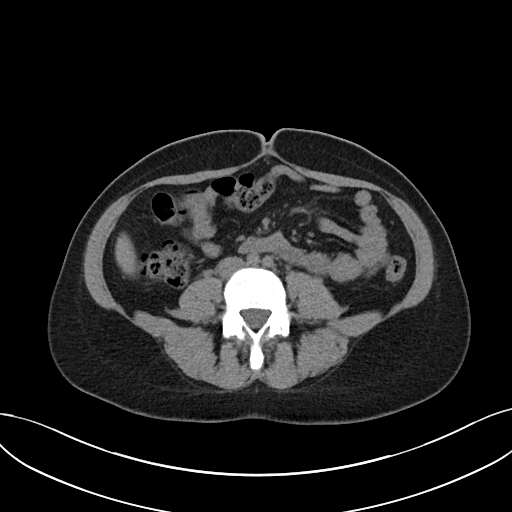
[im 49/84  soft-tissue]
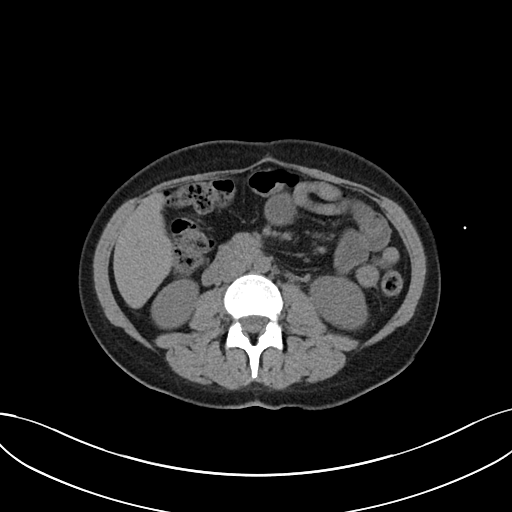
[im 56/84  soft-tissue]
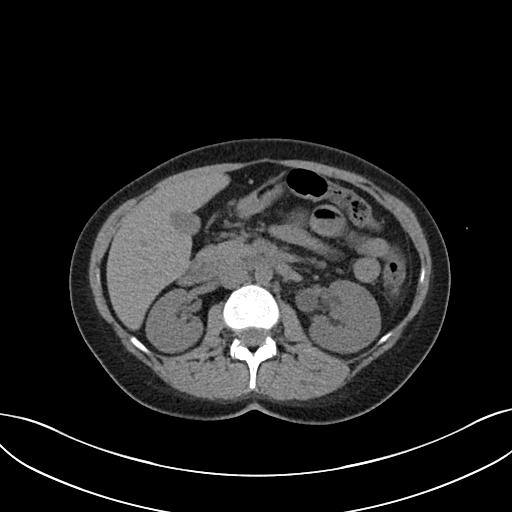
[im 56/84  bone]
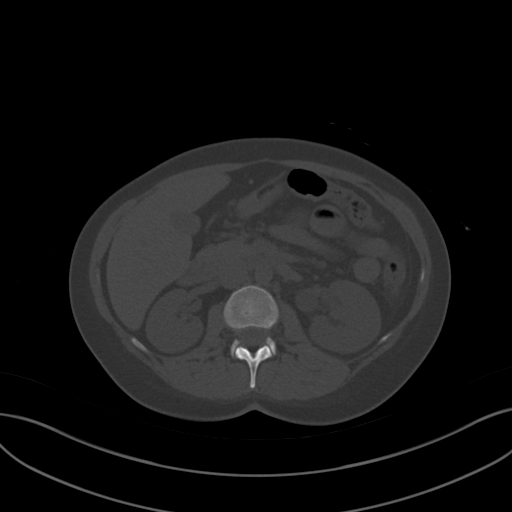
[im 59/84  soft-tissue]
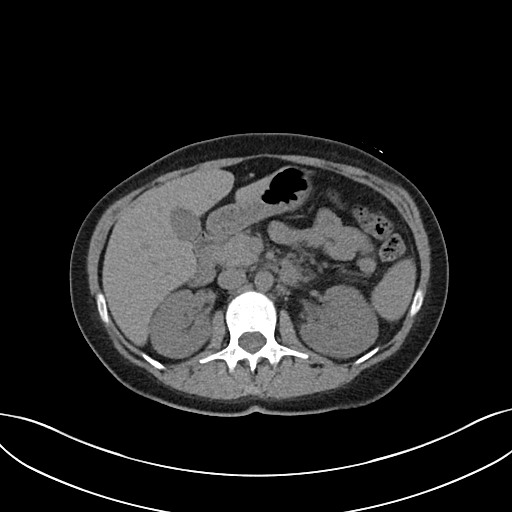
[im 66/84  soft-tissue]
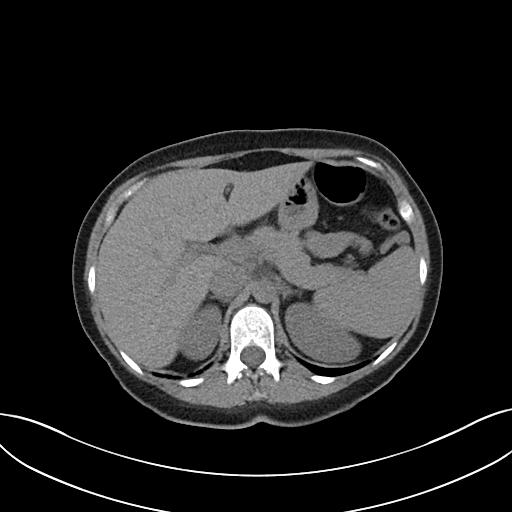
[im 73/84  soft-tissue]
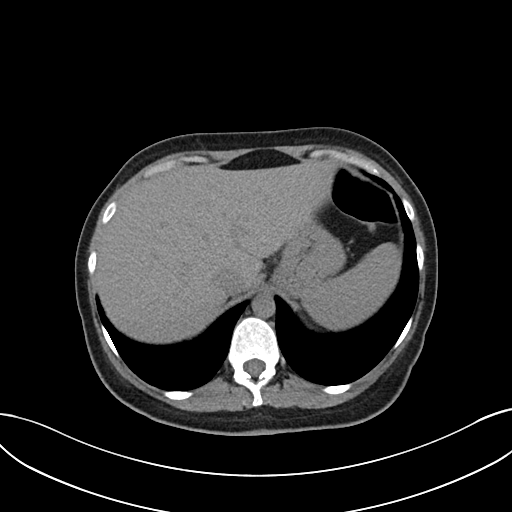
[im 80/84  soft-tissue]
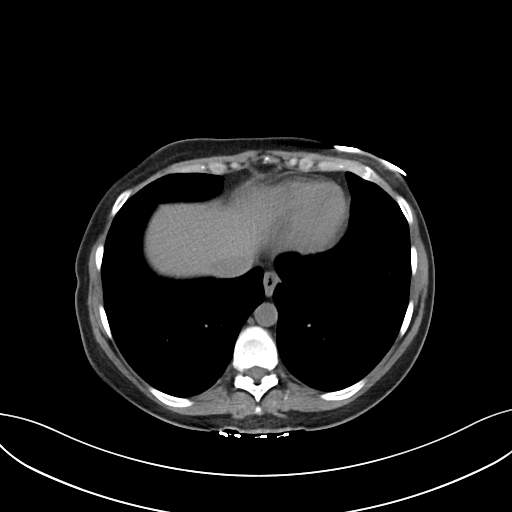

[Series 6: cor · coronal · 0.62mm/px · 3 of 114 slices shown]
[im 38/114  soft-tissue]
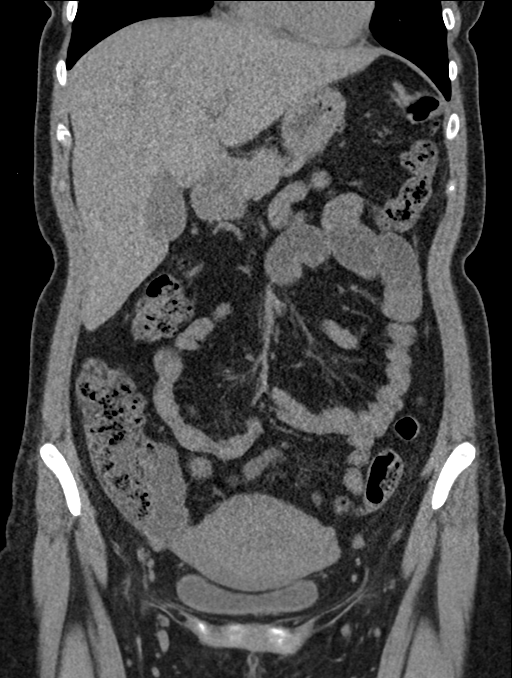
[im 51/114  soft-tissue]
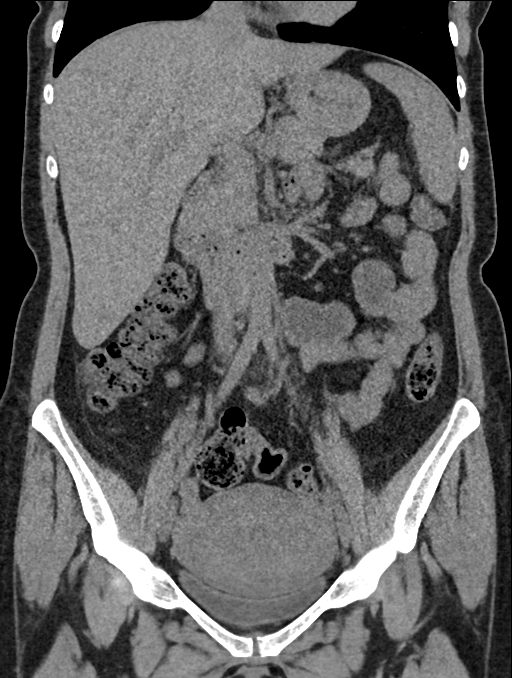
[im 63/114  soft-tissue]
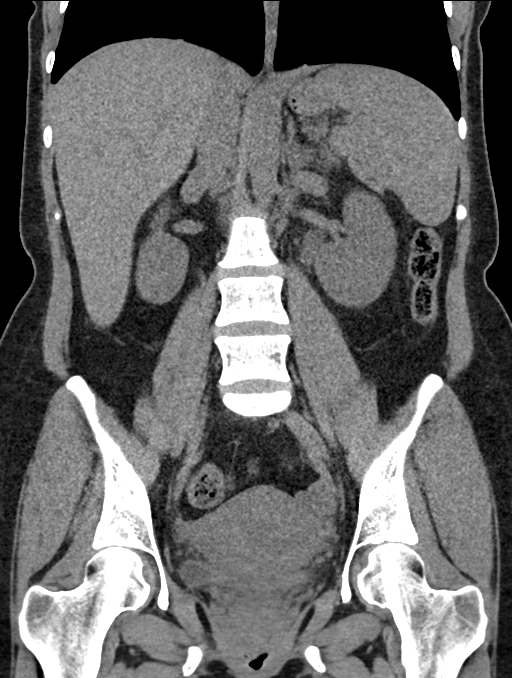

[16 of 46 positions shown; findings below may reference images not displayed]

FINDINGS: Lower chest: No acute abnormality.

Hepatobiliary: No focal liver abnormality is seen. No gallstones,
gallbladder wall thickening, or biliary dilatation.

Pancreas: Unremarkable. No pancreatic ductal dilatation or
surrounding inflammatory changes.

Spleen: Normal in size without focal abnormality.

Adrenals/Urinary Tract: Adrenal glands are unremarkable. There is an
obstructing 4 mm stone in the left proximal ureter (series 3, image
34), with resultant left sided hydroureteronephrosis. There is a 2
mm nonobstructive stone in the right mid kidney. Bladder is
unremarkable.

Stomach/Bowel: The stomach is within normal limits. There is no
evidence of bowel obstruction. Normal appendix.

Vascular/Lymphatic: No significant vascular findings are present. No
enlarged abdominal or pelvic lymph nodes.

Reproductive: Endometrial thickening, which is considered normal for
age.

Other: No abdominal wall hernia or abnormality. No abdominopelvic
ascites.

Musculoskeletal: No acute or significant osseous findings.
IMPRESSION: Obstructing 4 mm stone in the left proximal ureter with upstream
left-sided hydroureteronephrosis.

Nonobstructive 2 mm stone in the right mid kidney.

## 2024-10-27 ENCOUNTER — Ambulatory Visit
Admission: EM | Admit: 2024-10-27 | Discharge: 2024-10-27 | Disposition: A | Discharge status: Home / Self Care | Attending: Family Medicine | Admitting: Family Medicine

## 2024-10-27 DIAGNOSIS — F419 Anxiety disorder, unspecified: Secondary | ICD-10-CM

## 2024-10-27 NOTE — ED Triage Notes (Signed)
 Pt reports anxiety when taking exams around other people. Pt needs a diagnosed or reports to take the exam alone. Pt states she took hydroxyzine  before, gives no relief.

## 2024-10-27 NOTE — ED Provider Notes (Signed)
 Patient presents with a request for documentation for specialized accommodation in testing.  She reported that she had significant anxiety with taking exams around other people.  Has taken hydroxyzine  before for this without relief.  I informed patient that I am not in the specialty where and I provided this type of specialized accommodations.  I did offer help with the referral for treating her anxiety as a specialist practice that may be able to provide her formal documentation for her request the patient declined.  Follow-up with PCP.   Christopher Savannah, PA-C 10/27/24 1421
